# Patient Record
Sex: Female | Born: 1940 | Race: White | Hispanic: No | Marital: Married | Smoking: Never smoker
Health system: Southern US, Community
[De-identification: ages and names within clinical notes are randomized; demographics above are authoritative.]

## PROBLEM LIST (undated history)

## (undated) DIAGNOSIS — F419 Anxiety disorder, unspecified: Secondary | ICD-10-CM

## (undated) DIAGNOSIS — Z8669 Personal history of other diseases of the nervous system and sense organs: Secondary | ICD-10-CM

## (undated) DIAGNOSIS — I10 Essential (primary) hypertension: Secondary | ICD-10-CM

## (undated) DIAGNOSIS — H9193 Unspecified hearing loss, bilateral: Secondary | ICD-10-CM

## (undated) DIAGNOSIS — E785 Hyperlipidemia, unspecified: Secondary | ICD-10-CM

## (undated) HISTORY — DX: Essential (primary) hypertension: I10

## (undated) HISTORY — DX: Unspecified hearing loss, bilateral: H91.93

## (undated) HISTORY — PX: OTHER SURGICAL HISTORY: SHX169

## (undated) HISTORY — DX: Hyperlipidemia, unspecified: E78.5

## (undated) HISTORY — DX: Anxiety disorder, unspecified: F41.9

## (undated) HISTORY — PX: ABDOMINAL HYSTERECTOMY: SHX81

## (undated) HISTORY — DX: Personal history of other diseases of the nervous system and sense organs: Z86.69

---

## 1998-02-06 ENCOUNTER — Other Ambulatory Visit: Admission: RE | Admit: 1998-02-06 | Discharge: 1998-02-06 | Payer: Self-pay | Admitting: Obstetrics and Gynecology

## 1998-02-14 ENCOUNTER — Ambulatory Visit (HOSPITAL_COMMUNITY): Admission: RE | Admit: 1998-02-14 | Discharge: 1998-02-14 | Payer: Self-pay | Admitting: Obstetrics and Gynecology

## 1998-10-30 ENCOUNTER — Encounter: Payer: Self-pay | Admitting: Family Medicine

## 1998-10-30 ENCOUNTER — Ambulatory Visit (HOSPITAL_COMMUNITY): Admission: RE | Admit: 1998-10-30 | Discharge: 1998-10-30 | Payer: Self-pay | Admitting: Family Medicine

## 1998-11-14 ENCOUNTER — Inpatient Hospital Stay (HOSPITAL_COMMUNITY): Admission: AD | Admit: 1998-11-14 | Discharge: 1998-11-16 | Payer: Self-pay | Admitting: *Deleted

## 1998-11-14 ENCOUNTER — Emergency Department (HOSPITAL_COMMUNITY): Admission: EM | Admit: 1998-11-14 | Discharge: 1998-11-14 | Payer: Self-pay | Admitting: Emergency Medicine

## 1999-03-13 ENCOUNTER — Other Ambulatory Visit: Admission: RE | Admit: 1999-03-13 | Discharge: 1999-03-13 | Payer: Self-pay | Admitting: Family Medicine

## 2000-04-15 ENCOUNTER — Other Ambulatory Visit: Admission: RE | Admit: 2000-04-15 | Discharge: 2000-04-15 | Payer: Self-pay | Admitting: Family Medicine

## 2000-04-22 ENCOUNTER — Encounter: Payer: Self-pay | Admitting: Family Medicine

## 2000-04-22 ENCOUNTER — Ambulatory Visit (HOSPITAL_COMMUNITY): Admission: RE | Admit: 2000-04-22 | Discharge: 2000-04-22 | Payer: Self-pay | Admitting: Family Medicine

## 2001-07-01 ENCOUNTER — Ambulatory Visit (HOSPITAL_COMMUNITY): Admission: RE | Admit: 2001-07-01 | Discharge: 2001-07-01 | Payer: Self-pay | Admitting: Family Medicine

## 2001-07-01 ENCOUNTER — Encounter: Payer: Self-pay | Admitting: Family Medicine

## 2001-07-06 ENCOUNTER — Encounter: Payer: Self-pay | Admitting: Family Medicine

## 2001-07-06 ENCOUNTER — Encounter: Admission: RE | Admit: 2001-07-06 | Discharge: 2001-07-06 | Payer: Self-pay | Admitting: Family Medicine

## 2001-07-20 ENCOUNTER — Other Ambulatory Visit: Admission: RE | Admit: 2001-07-20 | Discharge: 2001-07-20 | Payer: Self-pay | Admitting: Family Medicine

## 2001-07-23 ENCOUNTER — Encounter: Admission: RE | Admit: 2001-07-23 | Discharge: 2001-07-23 | Payer: Self-pay | Admitting: Family Medicine

## 2001-07-23 ENCOUNTER — Encounter: Payer: Self-pay | Admitting: Family Medicine

## 2001-07-30 ENCOUNTER — Encounter: Admission: RE | Admit: 2001-07-30 | Discharge: 2001-07-30 | Payer: Self-pay | Admitting: Family Medicine

## 2001-07-30 ENCOUNTER — Encounter: Payer: Self-pay | Admitting: Family Medicine

## 2001-08-04 ENCOUNTER — Ambulatory Visit (HOSPITAL_COMMUNITY): Admission: RE | Admit: 2001-08-04 | Discharge: 2001-08-04 | Payer: Self-pay | Admitting: Internal Medicine

## 2001-08-04 ENCOUNTER — Encounter: Payer: Self-pay | Admitting: Obstetrics and Gynecology

## 2001-08-27 ENCOUNTER — Encounter: Payer: Self-pay | Admitting: Anesthesiology

## 2001-09-02 ENCOUNTER — Inpatient Hospital Stay (HOSPITAL_COMMUNITY): Admission: RE | Admit: 2001-09-02 | Discharge: 2001-09-04 | Payer: Self-pay | Admitting: Obstetrics and Gynecology

## 2001-09-02 ENCOUNTER — Encounter (INDEPENDENT_AMBULATORY_CARE_PROVIDER_SITE_OTHER): Payer: Self-pay | Admitting: *Deleted

## 2001-09-08 ENCOUNTER — Emergency Department (HOSPITAL_COMMUNITY): Admission: EM | Admit: 2001-09-08 | Discharge: 2001-09-08 | Payer: Self-pay | Admitting: Emergency Medicine

## 2001-09-08 ENCOUNTER — Encounter: Payer: Self-pay | Admitting: Emergency Medicine

## 2002-07-01 ENCOUNTER — Encounter: Payer: Self-pay | Admitting: Family Medicine

## 2002-07-01 ENCOUNTER — Ambulatory Visit (HOSPITAL_COMMUNITY): Admission: RE | Admit: 2002-07-01 | Discharge: 2002-07-01 | Payer: Self-pay | Admitting: Family Medicine

## 2002-07-09 ENCOUNTER — Encounter: Payer: Self-pay | Admitting: Family Medicine

## 2002-07-09 ENCOUNTER — Ambulatory Visit (HOSPITAL_COMMUNITY): Admission: RE | Admit: 2002-07-09 | Discharge: 2002-07-09 | Payer: Self-pay | Admitting: Family Medicine

## 2002-07-26 ENCOUNTER — Encounter: Payer: Self-pay | Admitting: Family Medicine

## 2002-07-26 ENCOUNTER — Encounter: Admission: RE | Admit: 2002-07-26 | Discharge: 2002-07-26 | Payer: Self-pay | Admitting: Family Medicine

## 2002-08-06 ENCOUNTER — Encounter: Admission: RE | Admit: 2002-08-06 | Discharge: 2002-08-06 | Payer: Self-pay | Admitting: Family Medicine

## 2002-08-06 ENCOUNTER — Encounter: Payer: Self-pay | Admitting: Family Medicine

## 2003-10-20 ENCOUNTER — Encounter: Admission: RE | Admit: 2003-10-20 | Discharge: 2003-10-20 | Payer: Self-pay | Admitting: Family Medicine

## 2004-09-19 ENCOUNTER — Encounter: Admission: RE | Admit: 2004-09-19 | Discharge: 2004-09-19 | Payer: Self-pay | Admitting: Family Medicine

## 2004-10-30 ENCOUNTER — Encounter: Admission: RE | Admit: 2004-10-30 | Discharge: 2004-10-30 | Payer: Self-pay | Admitting: Family Medicine

## 2005-12-12 ENCOUNTER — Encounter: Admission: RE | Admit: 2005-12-12 | Discharge: 2005-12-12 | Payer: Self-pay | Admitting: Family Medicine

## 2006-03-23 ENCOUNTER — Emergency Department (HOSPITAL_COMMUNITY): Admission: EM | Admit: 2006-03-23 | Discharge: 2006-03-23 | Payer: Self-pay | Admitting: Emergency Medicine

## 2006-12-18 ENCOUNTER — Encounter: Admission: RE | Admit: 2006-12-18 | Discharge: 2006-12-18 | Payer: Self-pay | Admitting: Family Medicine

## 2008-02-24 ENCOUNTER — Encounter: Admission: RE | Admit: 2008-02-24 | Discharge: 2008-02-24 | Payer: Self-pay | Admitting: Family Medicine

## 2008-07-14 ENCOUNTER — Encounter: Admission: RE | Admit: 2008-07-14 | Discharge: 2008-07-14 | Payer: Self-pay | Admitting: Family Medicine

## 2009-01-17 ENCOUNTER — Other Ambulatory Visit: Admission: RE | Admit: 2009-01-17 | Discharge: 2009-01-17 | Payer: Self-pay | Admitting: Internal Medicine

## 2009-01-17 ENCOUNTER — Ambulatory Visit: Payer: Self-pay | Admitting: Internal Medicine

## 2009-01-26 ENCOUNTER — Ambulatory Visit (HOSPITAL_COMMUNITY): Admission: RE | Admit: 2009-01-26 | Discharge: 2009-01-26 | Payer: Self-pay | Admitting: Internal Medicine

## 2009-01-26 ENCOUNTER — Encounter: Payer: Self-pay | Admitting: Internal Medicine

## 2009-01-26 ENCOUNTER — Ambulatory Visit: Payer: Self-pay | Admitting: Surgery

## 2009-02-09 ENCOUNTER — Ambulatory Visit: Payer: Self-pay | Admitting: Internal Medicine

## 2009-03-07 ENCOUNTER — Ambulatory Visit: Payer: Self-pay | Admitting: Vascular Surgery

## 2009-05-23 ENCOUNTER — Ambulatory Visit: Payer: Self-pay | Admitting: Internal Medicine

## 2009-07-13 ENCOUNTER — Ambulatory Visit: Payer: Self-pay | Admitting: Internal Medicine

## 2009-11-21 ENCOUNTER — Ambulatory Visit: Payer: Self-pay | Admitting: Internal Medicine

## 2010-05-22 ENCOUNTER — Ambulatory Visit: Payer: Self-pay | Admitting: Internal Medicine

## 2010-07-16 ENCOUNTER — Encounter: Admission: RE | Admit: 2010-07-16 | Discharge: 2010-07-16 | Payer: Self-pay | Admitting: Internal Medicine

## 2010-08-23 ENCOUNTER — Ambulatory Visit: Payer: Self-pay | Admitting: Internal Medicine

## 2010-10-28 ENCOUNTER — Encounter: Payer: Self-pay | Admitting: Gastroenterology

## 2010-12-11 ENCOUNTER — Other Ambulatory Visit: Payer: Self-pay | Admitting: Internal Medicine

## 2010-12-13 ENCOUNTER — Ambulatory Visit (INDEPENDENT_AMBULATORY_CARE_PROVIDER_SITE_OTHER): Payer: BC Managed Care – PPO | Admitting: Internal Medicine

## 2010-12-13 DIAGNOSIS — I10 Essential (primary) hypertension: Secondary | ICD-10-CM

## 2010-12-13 DIAGNOSIS — E039 Hypothyroidism, unspecified: Secondary | ICD-10-CM

## 2010-12-13 DIAGNOSIS — M1610 Unilateral primary osteoarthritis, unspecified hip: Secondary | ICD-10-CM

## 2010-12-13 DIAGNOSIS — M169 Osteoarthritis of hip, unspecified: Secondary | ICD-10-CM

## 2011-02-19 NOTE — Consult Note (Signed)
NEW PATIENT CONSULTATION   [name], [name] A  DOB:  1941/03/03                                       03/07/2009  ZOXWR#:[id number]   HISTORY OF PRESENT ILLNESS:  [name] is a 70 year old healthy female  found to have carotid bruit by Dr. Lenord Fellers and a carotid duplex exam  performed in April 2010 at [facility] revealed moderate  bilateral carotid occlusive disease.  She was referred for evaluation.  She denies history hemispheric or non-hemispheric TIAs, amaurosis fugax,  diplopia, blurred vision, syncope or previous stroke.   PAST MEDICAL HISTORY:  1. Hypertension.  2. Hyperlipidemia.  3. Hypothyroidism.  4. History of osteopenia.  5. Bilateral hearing loss.  6. Negative for coronary artery disease, diabetes, stroke or COPD.   PAST SURGICAL HISTORY:  Hysterectomy.   FAMILY HISTORY:  Positive for coronary artery disease in her father.  He  had coronary bypass grafting. Diabetes in the grandmother.  Negative for  stroke.   SOCIAL HISTORY:  She is married, has three children and is retired.  She  does not use tobacco or alcohol.   REVIEW OF SYSTEMS:  Essentially unremarkable with no weight gain,  anorexia, chest pain, dyspnea on exertion, PND, orthopnea, hemoptysis,  wheezing.  Does have arthritis and some degree of depression and  nervousness.   ALLERGIES:  None known.   MEDICATIONS:  Please see health history exam.   PHYSICAL EXAMINATION:  VITAL SIGNS:  Blood pressure 140/80, heart rate  70, respirations 14.  GENERAL:  This is a healthy-appearing female in no apparent stress alert  and oriented x3.  NECK:  Supple. 3+ carotid pulses palpable.  There is a soft bruit on the  left.  NEUROLOGIC:  Normal.  No palpable adenopathy in the neck.  CHEST:  Clear to auscultation.  CARDIOVASCULAR:  Regular rate and rhythm.  There is no murmurs.  ABDOMEN:  Soft, nontender with no masses.  EXTREMITIES:  She has 3+ femoral popliteal and posterior tibial  pulses  palpable bilaterally.   I reviewed the carotid duplex exam done at [facility] on January 26, 2009 and it appears that her internal carotid stenoses is in the 50-60%  range at most.  Since she is asymptomatic there is no indication for any  treatment or further evaluation at this time.  I will see her back in 1  year for follow-up carotid duplex exam in our office to discuss this  further with her unless she has any symptoms in the interim.   Quita Skye Hart [place], M.D.  Electronically Signed   JDL/MEDQ  D:  03/07/2009  T:  03/08/2009  Job:  [id number]   cc:   Luanna Cole. Lenord Fellers, M.D.

## 2011-02-22 NOTE — Discharge Summary (Signed)
[facility]  Patient:    [name], [name] Visit Number: [id number] MRN: [id number]          Service Type: SUR Location: 4W 0457 02 Attending Physician:  Malon Kindle Dictated by:   Malachi Pro. Ambrose Mantle, M.D. Admit Date:  09/02/2001 Disc. Date: 09/04/01   CC:         Mamie Nick, M.D.   Discharge Summary  HISTORY OF PRESENT ILLNESS: This is a 71 year old white female with a left adnexal abnormality on ultrasound and CT scan for diagnostic laparoscopy and possible TAH/BSO. The patient underwent laparoscopy with findings of dense adhesions of the sigmoid colon to the left tube and ovary that obliterated the left ovary. The patient had requested that if we did confirm pathology to go ahead and remove the uterus, tubes, and ovaries, so a laparoscopy was done, abdominal hysterectomy, bilateral salpingo-oophorectomy, and lysis of the dense adhesions of the sigmoid to the left tube and ovary. There were also peritoneal inclusions in the cul-de-sac. These were also removed.  Postoperatively, the patient did well. Her abdomen remained soft and nontender. She did not pass flatus and she tolerated liquids well. She voided well without difficulty and was ambulating well without assistance.  ADMISSION LABORATORY DATA: Showed a comprehensive metabolic profile to be normal except for a BUN of 27, a glucose of 130. This was taken 1 hour after eating a Pop Tart. White count was 68,000, hemoglobin 14, hematocrit 40.8, platelet count 311,000, normal differential. Urinalysis was negative. Postoperative hematocrit was 34.  ECG showed normal sinus rhythm, nonspecific T wave abnormality. 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 Pathology report is not available but other than the tremendous scar tissue along the sigmoid colon to the left tube and ovary, I do not think there was any significant  pathology.  OPERATION: Diagnostic open laparotomy with abdominal hysterectomy, bilateral salpingo-oophorectomy, and lysis of adhesions.  FINAL CONDITION: Improved.  INSTRUCTIONS: Include a clear liquid diet until passing flatus. Call with severe abdominal cramping, nausea or vomiting. Call with any fever above 100.4 degrees. Call with any heavy vaginal bleeding or any other problems. Mepergan Fortis 16 tablets 1 q.4-6h. as needed for pain.  The patient is advised to return in 3-4 days to have her staples removed.  FINAL DIAGNOSES: History of complex left ovarian cyst on ultrasound with persistent free fluid on ultrasound that suggested compartmentalization of the fluid with scarring. Dense pelvic adhesions involving the sigmoid colon, the left tube and ovary and cul-de-sac. Diverticulosis by computed tomographic scan without evidence of diverticulitis. Dictated by:   Malachi Pro. Ambrose Mantle, M.D.  Attending Physician:  Malon Kindle DD:  09/04/01 TD:  09/04/01 Job: [id number]

## 2011-02-22 NOTE — Op Note (Signed)
[facility]  Patient:    [name], [name] Visit Number: [id number] MRN: [id number]          Service Type: SUR Location: 4W 0457 02 Attending Physician:  Malon Kindle Dictated by:   Malachi Pro. Ambrose Mantle, M.D. Proc. Date: 09/02/01 Admit Date:  09/02/2001   CC:         Quita Skye. Artis Flock, M.D.   Operative Report  PREOPERATIVE DIAGNOSES:  Left adnexal abnormality on ultrasound with suggestion of peritoneal fluid that was loculated as well as a history of 4.8 cm complex cystic mass in the left ovary on ultrasound.  POSTOPERATIVE DIAGNOSES:  Left adnexal abnormality on ultrasound with suggestion of peritoneal fluid that was loculated as well as a history of 4.8 cm complex cystic mass in the left ovary on ultrasound. Dense adhesions of the sigmoid colon to the left tube and ovary and cul-de-sac, peritoneal inclusions in the cul-de-sac.  OPERATION PERFORMED:  1. Diagnostic open laparoscopy.  2. Laparotomy with abdominal hysterectomy and bilateral     salpingo-oophorectomy.  3. Lysis of adhesions.  SURGEON:  Malachi Pro. Ambrose Mantle, M.D.  ASSISTANT:  Alvino Chapel, M.D.  ANESTHESIA:  General.  DESCRIPTION OF PROCEDURE:  The patient was brought to the operating room and placed under satisfactory general anesthesia, placed in the [facility] stirrups. The abdomen, vulva, vagina and urethra were prepped with Betadine solution. A Foley catheter was inserted to straight drain. A Hulka cannula was placed into the uterus and attached to the anterior cervical lip. The abdomen was then draped as a sterile field. A semilunar subumbilical incision was made and carried in layers down to the fascia. The fascia was elevated and sized and the peritoneum was opened bluntly. The Hasson cannula was placed into the peritoneal cavity after a pursestring suture of #0 Vicryl was used to pull around the Hasson cannula in the fascia. The abdominal cavity was inflated.  I inserted another trocar under direct vision suprapubically, elevated the uterus into the operative field. The uterus was anterior normal size, the cul-de-sac was obliterated by adhesions from the sigmoid colon to the cul-de-sac and to the left adnexa. The anterior cul-de-sac was normal. The right tube and ovary were normal. I could not see the left tube and ovary. Because the patient had expressed a desire to go ahead and remove the uterus, tubes and ovaries if we did confirm pathology, I removed the cannula, I closed the subumbilical incision in the fascia by pulling tight on the fascial suture and then regloved and cut through the skin, subcutaneous tissue and fascia suprapubically. I separated the fascia from the rectus muscles superiorly and inferiorly and I opened the peritoneum vertically.  Exploration of the upper abdomen revealed there was one adhesions from the omentum to the right anterior abdominal wall. This was divided with the Bovie. The liver was smooth, the gallbladder was smooth and cystic without stones. Both kidneys felt normal. Exploration of the pelvis revealed the previously described findings seen at laparoscopy. Packs and retractors were used to prepare the operative field. The uterus was elevated with clamps across the upper pedicles. Both round ligaments were divided and a bladder flap was developed. With considerable dissection, the sigmoid colon was separated from the left adnexa sharply and bluntly. The patient had been known to have diverticulosis on CT scan. There was no evidence of diverticulitis on CT scan and I did not see any diverticulitis now. I could not be sure that there were diverticula, but I am sure  there are. At any rate, after the sigmoid colon was protected and divided away from the left adnexa, both infundibulopelvic ligaments were divided between clamps and then the uterine vessels were clamped, cut and suture ligated after they were  skeletonized. Double ligatures were placed across each infundibulopelvic ligament. The bladder was pushed well inferiorly, the parametrial and paracervical tissues were clamped, cut and suture ligated and the uterosacral ligaments bilaterally were divided and suture ligated and held. The right vaginal angle was entered and then the uterus, tubes and ovaries were removed by transecting the upper vagina. Vaginal angled sutures were placed and the central portion of the vagina was closed with interrupted figure-of-eight sutures of #0 Vicryl. A diligent search was made for any bleeders. I controlled all of them with suture and electrical current. The uterosacral ligaments were sutured together in the midline with two sutures of #0 Vicryl to give support to the vagina. I filled the bladder with methylene blue stained saline, there was no leaking, there was no evidence that the bladder was close to any sutures. Reperitonealization was done across the vaginal cuff. Again hemostasis appeared adequate. The bowel appeared relatively normal except for several of what I felt were diverticula. Packs and retractors were removed. The abdominal wall was closed with a running suture of #0 Vicryl on the peritoneum, interrupted #0 Vicryl on the rectus muscle, two running sutures of #0 Vicryl on the anterior rectus fascia, running 3-0 Vicryl on the subcu tissue and staples on the skin. A staple was also  used for the lower abdominal trocar incision as well as staples were used to close the umbilical skin incision. Blood loss for the procedure was thought to be about 250 cc. Pelvic washings were obtained as soon as we entered the abdominal cavity but they were discarded into the procedure because there was no evidence that there was any chance of ovarian cancer. Dictated by:   Malachi Pro. Ambrose Mantle, M.D. Attending Physician:  Malon Kindle DD:  09/02/01 TD:  09/02/01 Job: [id number]

## 2011-02-22 NOTE — H&P (Signed)
[facility]  Patient:    [name], [name] Visit Number: [id number] MRN: [id number]          Service Type: DSU Location: DAY Attending Physician:  Malon Kindle Dictated by:   Malachi Pro. Ambrose Mantle, M.D. Admit Date:  09/02/2001                           History and Physical  HISTORY OF PRESENT ILLNESS:  A 70 year old, white, married female, para 3-0-1-2, who is admitted to the hospital for diagnostic laparoscopy, possible laparotomy with abdominal hysterectomy and bilateral salpingo-oophorectomy.  This patient states that she has been in relatively good health, except for annual bout of lower abdominal pain with gas and belching that lasts about three days.  These symptoms occurred in September, but lasted longer than the usual three days.  She saw Quita Skye. Kindl, M.D., for routine exam and he felt a left adnexal fullness.  An ultrasound showed a 4.8 cm complex left ovarian cyst.  A CT scan was done that confirmed free fluid, but did not show the cyst.  The ultrasound report done on July 23, 2001, showed a 4.8 cm complex septated cyst measuring 3.3 cm x 3 cm x 4.8 cm. 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  Surrounding the left ovary was a moderate amount of free fluid.  No upper abdominal free fluid was seen.  The patient then had a CT scan of the abdomen and pelvis.  The abdominal parenchymal organs were normal in appearance.  There was no evidence of mass, adenopathy, inflammatory process, or abnormal fluid collections.  There was a subcentimeter inferior right renal cyst.  CT scan of the pelvis showed a moderate amount of pelvic free fluid. The ultrasound finding of an ovarian cystic focus is probably silhouetted by pelvic free fluid and is not identified as a discrete finding.  No adenopathy was seen.  No mesenteric or omental metastatic disease was seen.  There was moderate sigmoid  diverticulosis without diverticulitis.  The uterus, opacified intestine, and bladder were otherwise unremarkable.  When I examined the patient on July 31, 2001, I could not feel an adnexal mass, in fact her left adnexa felt perfectly normal.  Therefore, we repeated the ultrasound on August 04, 2001.  Again, there was a normal uterus and a moderate of fluid in the cul-de-sac.  Thin septations were noted in the fluid which were consistent with adhesions.  The previously noted complex cystic mass on the left was not seen.  It was possible that it was obscured by gas.  Another possibility would be that it represented a peritoneal inclusion cyst.  So at this point since we had an indeterminate finding and we did have a history of an enlarged left ovary, I recommended to the patient that she undergo laparoscopy before having more surgery.  She is unable to state whether she has dyspareunia because she has had very rare intercourse over the last three years, no more than three intercourse episodes in three years.  ALLERGIES:  No known allergies.  MEDICATIONS:  She takes Estrace, Provera, Lipitor, and Vioxx.  PAST SURGICAL HISTORY:  She did have periclitoral cyst removed.  She has had a tubal ligation.  PAST MEDICAL HISTORY:  She has had no significant nature adult illness, but she does take Lipitor for high cholesterol.  She has no known heart problems. She does not drink or smoke.  REVIEW  OF SYSTEMS:  Noncontributory.  FAMILY HISTORY:  Her mother died in her 104s of high blood pressure.  Her father died of an uncertain age of heart problems and lung cancer.  A sister and brother are both okay, but the sister does have high blood pressure.  The patient had a son who was a drug addict and he was killed in a robbery.  PHYSICAL EXAMINATION:  A well-developed, well-nourished, white female.  VITAL SIGNS:  Blood pressure 170/90, pulse 80.  WEIGHT:  152-1/2 pounds.  HEENT:  No cranial  abnormalities.  Extraocular movements are intact.  The nose and pharynx are clear.  There is an upper dental plate.  NECK:  Supple without thyromegaly.  HEART:  Normal size and sounds.  No murmurs.  LUNGS:  Clear to P&A.  BREASTS:  Soft without masses.  ABDOMEN:  Soft.  There are no masses palpable.  There is lower abdominal direct tenderness without rebound.  The liver, spleen, and kidneys are not felt.  PELVIC:  The vulva and vagina are clean.  The cervix is clean.  The uterus is anterior and normal size.  I do not feel an adnexal mass.  In fact, there is no suggestion of a mass on physical exam.  ADMITTING IMPRESSION:  History of complex cyst left adnexal mass measuring up to 4.8 cm in diameter with free pelvic fluid.  The free fluid persisted on two additional studies with compartmentalization on a follow-up ultrasound.  A Pap smear on July 20, 2001, was negative.  PLAN:  The patient is to undergo diagnostic laparoscopy and if the pelvis is completely without pathology, then no other surgery will be done.  If significant gynecologic pathology is present, the patient will undergo hysterectomy and removal of tubes and ovaries. Dictated by:   Malachi Pro. Ambrose Mantle, M.D. Attending Physician:  Malon Kindle DD:  09/02/01 TD:  09/02/01 Job: [id number]

## 2011-04-01 ENCOUNTER — Other Ambulatory Visit: Payer: Self-pay | Admitting: Internal Medicine

## 2011-06-14 ENCOUNTER — Other Ambulatory Visit: Payer: BC Managed Care – PPO | Admitting: Internal Medicine

## 2011-06-14 DIAGNOSIS — E785 Hyperlipidemia, unspecified: Secondary | ICD-10-CM

## 2011-06-14 DIAGNOSIS — Z79899 Other long term (current) drug therapy: Secondary | ICD-10-CM

## 2011-06-14 DIAGNOSIS — E039 Hypothyroidism, unspecified: Secondary | ICD-10-CM

## 2011-06-14 LAB — LIPID PANEL
Cholesterol: 227 mg/dL — ABNORMAL HIGH (ref 0–200)
HDL: 60 mg/dL (ref >39)
LDL Cholesterol: 142 mg/dL — ABNORMAL HIGH (ref 0–99)
Triglycerides: 125 mg/dL (ref <150)

## 2011-06-14 LAB — HEPATIC FUNCTION PANEL
ALT: 17 U/L (ref 0–35)
AST: 19 U/L (ref 0–37)
Albumin: 4.3 g/dL (ref 3.5–5.2)
Alkaline Phosphatase: 67 U/L (ref 39–117)
Bilirubin, Direct: 0.1 mg/dL (ref 0.0–0.3)
Indirect Bilirubin: 0.5 mg/dL (ref 0.0–0.9)
Total Protein: 7.2 g/dL (ref 6.0–8.3)

## 2011-06-14 LAB — TSH: TSH: 1.178 u[IU]/mL (ref 0.350–4.500)

## 2011-06-17 ENCOUNTER — Ambulatory Visit (INDEPENDENT_AMBULATORY_CARE_PROVIDER_SITE_OTHER): Payer: BC Managed Care – PPO | Admitting: Internal Medicine

## 2011-06-17 ENCOUNTER — Encounter: Payer: Self-pay | Admitting: Internal Medicine

## 2011-06-17 DIAGNOSIS — Z23 Encounter for immunization: Secondary | ICD-10-CM

## 2011-06-17 DIAGNOSIS — E785 Hyperlipidemia, unspecified: Secondary | ICD-10-CM | POA: Insufficient documentation

## 2011-06-17 DIAGNOSIS — M79609 Pain in unspecified limb: Secondary | ICD-10-CM

## 2011-06-17 DIAGNOSIS — M79606 Pain in leg, unspecified: Secondary | ICD-10-CM

## 2011-06-17 DIAGNOSIS — E039 Hypothyroidism, unspecified: Secondary | ICD-10-CM

## 2011-06-17 NOTE — Patient Instructions (Signed)
Please take Zocor daily as directed at supper. Continue with signed dose of Synthroid and signed dose of Zocor. An influenza immunization is been given today. Return in 6 months for physical exam.

## 2011-06-17 NOTE — Progress Notes (Signed)
  Subjective:    Patient ID: [name], female    DOB: 11/27/1940, 70 y.o.   MRN: [id number]  HPI  70 year old white female retired with history of hypothyroidism and hyperlipidemia. Admits that she's not been taking her Zocor on a regular basis and her lipid panel certainly reflects that. 6 months ago total cholesterol was 198 and it is now 227. LDL cholesterol 6 months ago was 116 and is now 142. Says she forgets at suppertime to take her medication but that is her bigger meal of the day. TSH is normal at 1.178 on thyroid replacement therapy. Also, sometimes her left lateral leg hurts around her knee area but she sits in a recliner and uses her legs to return from a reclining to an upright position. Suspect this is musculoskeletal pain and can be treated symptomatically.    Review of Systems     Objective:   Physical Exam chest is clear to auscultation; cardiac exam regular rate and rhythm normal S1 and S2; extremities without edema. Left lateral leg-mild tenderness.        Assessment & Plan:  Hypothyroidism  Hyperlipidemia  Noncompliance with medical regimen  Musculoskeletal pain left lateral leg  Encouraged patient to be compliant with Zocor on a daily basis. Return in 6 months for physical examination. Continue with same dose of Synthroid and same dose of Zocor for now. Influenza immunization given. Recommended Tylenol by mouth as needed for leg pain and may apply ice to left lateral leg 20 minutes daily for relief of pain.

## 2011-07-01 ENCOUNTER — Other Ambulatory Visit: Payer: Self-pay | Admitting: Internal Medicine

## 2011-09-01 ENCOUNTER — Other Ambulatory Visit: Payer: Self-pay | Admitting: Internal Medicine

## 2011-12-02 ENCOUNTER — Other Ambulatory Visit: Payer: Self-pay | Admitting: Internal Medicine

## 2011-12-23 ENCOUNTER — Other Ambulatory Visit: Payer: BC Managed Care – PPO | Admitting: Internal Medicine

## 2011-12-23 DIAGNOSIS — E785 Hyperlipidemia, unspecified: Secondary | ICD-10-CM

## 2011-12-23 DIAGNOSIS — Z0000 Encounter for general adult medical examination without abnormal findings: Secondary | ICD-10-CM

## 2011-12-23 DIAGNOSIS — E039 Hypothyroidism, unspecified: Secondary | ICD-10-CM

## 2011-12-23 LAB — COMPREHENSIVE METABOLIC PANEL
ALT: 15 U/L (ref 0–35)
AST: 23 U/L (ref 0–37)
Albumin: 4.6 g/dL (ref 3.5–5.2)
Alkaline Phosphatase: 67 U/L (ref 39–117)
BUN: 19 mg/dL (ref 6–23)
CO2: 27 mEq/L (ref 19–32)
Calcium: 9.7 mg/dL (ref 8.4–10.5)
Chloride: 102 mEq/L (ref 96–112)
Creat: 0.8 mg/dL (ref 0.50–1.10)
Glucose, Bld: 100 mg/dL — ABNORMAL HIGH (ref 70–99)
Potassium: 3.9 mEq/L (ref 3.5–5.3)
Sodium: 142 mEq/L (ref 135–145)
Total Bilirubin: 0.5 mg/dL (ref 0.3–1.2)

## 2011-12-23 LAB — LIPID PANEL
HDL: 63 mg/dL (ref >39)
LDL Cholesterol: 120 mg/dL — ABNORMAL HIGH (ref 0–99)
Total CHOL/HDL Ratio: 3.2 Ratio
Triglycerides: 106 mg/dL (ref <150)
VLDL: 21 mg/dL (ref 0–40)

## 2011-12-23 LAB — CBC WITH DIFFERENTIAL/PLATELET
Basophils Absolute: 0 10*3/uL (ref 0.0–0.1)
Basophils Relative: 0 % (ref 0–1)
Eosinophils Absolute: 0 10*3/uL (ref 0.0–0.7)
Eosinophils Relative: 1 % (ref 0–5)
HCT: 46.3 % — ABNORMAL HIGH (ref 36.0–46.0)
Hemoglobin: 15 g/dL (ref 12.0–15.0)
Lymphocytes Relative: 36 % (ref 12–46)
Lymphs Abs: 2 10*3/uL (ref 0.7–4.0)
MCH: 28.2 pg (ref 26.0–34.0)
MCHC: 32.4 g/dL (ref 30.0–36.0)
MCV: 87 fL (ref 78.0–100.0)
Monocytes Absolute: 0.6 10*3/uL (ref 0.1–1.0)
Monocytes Relative: 11 % (ref 3–12)
Neutro Abs: 3 10*3/uL (ref 1.7–7.7)
Neutrophils Relative %: 52 % (ref 43–77)
Platelets: 259 10*3/uL (ref 150–400)
RDW: 15.9 % — ABNORMAL HIGH (ref 11.5–15.5)
WBC: 5.7 10*3/uL (ref 4.0–10.5)

## 2011-12-24 ENCOUNTER — Ambulatory Visit (INDEPENDENT_AMBULATORY_CARE_PROVIDER_SITE_OTHER): Payer: BC Managed Care – PPO | Admitting: Internal Medicine

## 2011-12-24 ENCOUNTER — Encounter: Payer: Self-pay | Admitting: Internal Medicine

## 2011-12-24 VITALS — BP 144/80 | HR 72 | Temp 97.9°F | Ht 63.0 in | Wt 159.0 lb

## 2011-12-24 DIAGNOSIS — R0989 Other specified symptoms and signs involving the circulatory and respiratory systems: Secondary | ICD-10-CM

## 2011-12-24 DIAGNOSIS — F419 Anxiety disorder, unspecified: Secondary | ICD-10-CM | POA: Insufficient documentation

## 2011-12-24 DIAGNOSIS — F411 Generalized anxiety disorder: Secondary | ICD-10-CM

## 2011-12-24 DIAGNOSIS — Z0000 Encounter for general adult medical examination without abnormal findings: Secondary | ICD-10-CM

## 2011-12-24 DIAGNOSIS — I10 Essential (primary) hypertension: Secondary | ICD-10-CM

## 2011-12-24 LAB — POCT URINALYSIS DIPSTICK
Blood, UA: NEGATIVE
Glucose, UA: NEGATIVE
Ketones, UA: NEGATIVE
Leukocytes, UA: NEGATIVE
Nitrite, UA: NEGATIVE
Protein, UA: NEGATIVE
Spec Grav, UA: 1.01
pH, UA: 6

## 2011-12-24 LAB — VITAMIN D 25 HYDROXY (VIT D DEFICIENCY, FRACTURES): Vit D, 25-Hydroxy: 40 ng/mL (ref 30–89)

## 2011-12-24 NOTE — Progress Notes (Signed)
Subjective:    Patient ID: [name], female    DOB: [birth year], 71 y.o.   MRN: [id number]  HPI   71 year old white female in today for health maintenance and evaluation of medical problems. History of hysterectomy 2002 bilateral oophorectomy. History of hypertension treated with metoprolol and HCTZ. History of hypothyroidism treated with Synthroid. Bilateral hearing loss wearing bilateral hearing aids. Remote history of migraine headache. History of left carotid bruit. History of vitamin D deficiency. History of anxiety.  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 Pneumonia 2003. She did not get mammogram and 2012. Reminded her to get one this year. Bone density study 2002. Zostavax vaccine August 2011. Pneumovax 02-06-09.  Family history father died at age 47 of cancer. He had a history of MI. Mother with history of hypertension. Sister with history of hypertension. 2 adult sons.  Patient does not smoke or consume alcohol. She is married and completed high school and is retired.  Old records indicate she had carotid duplex scan in 1999 at [facility] long showing a 25-30% stenosis of the right internal carotid artery and a 30-35 stenosis in the left internal carotid artery . Has never had a colonoscopy and this is been discussed with her previously and she has to climb. Bone density study in 2002 showed a T score of -1.7 in the LS spine and femoral neck of -2.3.  Patient admits to not exercising much this winter. Likes to get out with her dog and walk in spring and summer.    Review of Systems  Constitutional: Negative.   HENT: Negative.   Eyes: Negative.   Respiratory: Negative.   Cardiovascular: Negative.   Gastrointestinal: Negative.   Genitourinary: Negative.   Musculoskeletal: Negative.   Neurological: Negative.   Hematological: Negative.   Psychiatric/Behavioral: Negative.        Objective:   Physical Exam  Vitals reviewed. Constitutional: She is  oriented to person, place, and time. She appears well-developed and well-nourished.  HENT:  Head: Normocephalic and atraumatic.  Right Ear: External ear normal.  Left Ear: External ear normal.  Mouth/Throat: Oropharynx is clear and moist.  Eyes: Conjunctivae are normal. Pupils are equal, round, and reactive to light. No scleral icterus.  Neck: Neck supple. No JVD present. No tracheal deviation present. No thyromegaly present.       Left carotid bruit  Cardiovascular: Normal rate, regular rhythm, normal heart sounds and intact distal pulses.   No murmur heard. Pulmonary/Chest: Effort normal and breath sounds normal. She has no wheezes. She has no rales. She exhibits no tenderness.       Breasts normal female  Abdominal: Soft. Bowel sounds are normal. She exhibits no distension and no mass. There is no tenderness. There is no guarding.  Genitourinary:       Deferred status post hysterectomy. Last Pap of vaginal cuff 2010  Musculoskeletal: Normal range of motion. She exhibits no edema.  Lymphadenopathy:    She has no cervical adenopathy.  Neurological: She is alert and oriented to person, place, and time. She has normal reflexes. She displays normal reflexes. No cranial nerve deficit. Coordination normal.  Skin: Skin is warm and dry. No rash noted. No erythema. No pallor.  Psychiatric: She has a normal mood and affect. Her behavior is normal. Judgment and thought content normal.          Assessment & Plan:  Hypertension  Hyperlipidemia  Hypothyroidism  Bilateral hearing loss  Left carotid bruit  History of vitamin  D deficiency  Anxiety  Plan: Patient encouraged diet exercise and lose some weight. Return in 6 months for TSH, fasting lipid panel, liver function, blood pressure check and office visit .

## 2011-12-24 NOTE — Patient Instructions (Signed)
Continue same medications. Try the diet exercise and lose weight. Return in 6 months.

## 2012-01-01 ENCOUNTER — Other Ambulatory Visit: Payer: Self-pay | Admitting: Internal Medicine

## 2012-04-03 ENCOUNTER — Other Ambulatory Visit: Payer: Self-pay | Admitting: Internal Medicine

## 2012-06-26 ENCOUNTER — Other Ambulatory Visit: Payer: Self-pay | Admitting: Internal Medicine

## 2012-06-26 DIAGNOSIS — Z1231 Encounter for screening mammogram for malignant neoplasm of breast: Secondary | ICD-10-CM

## 2012-06-29 ENCOUNTER — Other Ambulatory Visit: Payer: BC Managed Care – PPO | Admitting: Internal Medicine

## 2012-06-29 DIAGNOSIS — E039 Hypothyroidism, unspecified: Secondary | ICD-10-CM

## 2012-06-29 DIAGNOSIS — E785 Hyperlipidemia, unspecified: Secondary | ICD-10-CM

## 2012-06-29 DIAGNOSIS — Z79899 Other long term (current) drug therapy: Secondary | ICD-10-CM

## 2012-06-29 LAB — HEPATIC FUNCTION PANEL
ALT: 14 U/L (ref 0–35)
AST: 17 U/L (ref 0–37)
Albumin: 4.3 g/dL (ref 3.5–5.2)
Bilirubin, Direct: 0.1 mg/dL (ref 0.0–0.3)
Total Bilirubin: 0.5 mg/dL (ref 0.3–1.2)
Total Protein: 6.8 g/dL (ref 6.0–8.3)

## 2012-06-29 LAB — LIPID PANEL
Cholesterol: 189 mg/dL (ref 0–200)
HDL: 57 mg/dL (ref >39)
Total CHOL/HDL Ratio: 3.3 Ratio
Triglycerides: 142 mg/dL (ref <150)
VLDL: 28 mg/dL (ref 0–40)

## 2012-06-29 LAB — TSH: TSH: 1.414 u[IU]/mL (ref 0.350–4.500)

## 2012-06-30 ENCOUNTER — Ambulatory Visit (INDEPENDENT_AMBULATORY_CARE_PROVIDER_SITE_OTHER): Payer: BC Managed Care – PPO | Admitting: Internal Medicine

## 2012-06-30 ENCOUNTER — Encounter: Payer: Self-pay | Admitting: Internal Medicine

## 2012-06-30 VITALS — BP 140/80 | HR 64 | Temp 97.5°F | Ht 63.0 in | Wt 159.5 lb

## 2012-06-30 DIAGNOSIS — Z23 Encounter for immunization: Secondary | ICD-10-CM

## 2012-06-30 DIAGNOSIS — E039 Hypothyroidism, unspecified: Secondary | ICD-10-CM

## 2012-06-30 DIAGNOSIS — I10 Essential (primary) hypertension: Secondary | ICD-10-CM

## 2012-06-30 DIAGNOSIS — E785 Hyperlipidemia, unspecified: Secondary | ICD-10-CM

## 2012-06-30 DIAGNOSIS — N951 Menopausal and female climacteric states: Secondary | ICD-10-CM | POA: Insufficient documentation

## 2012-06-30 NOTE — Progress Notes (Signed)
  Subjective:    Patient ID: [name], female    DOB: [birth year], 71 y.o.   MRN: [id number]  HPI Moved back to old house recently after renovations. Busy moving. BP elevated today. Thinks blood pressure is elevated due to stress. History of hypertension hyperlipidemia hypothyroidism for six-month recheck. Complaining bitterly of hot flashes. Used to be on estrogen replacement status post hysterectomy but I had her stop that a number of months ago. At this point she really would like to restart Premarin orally status post hysterectomy BSO because she is having severe hot flashes at night.    Review of Systems     Objective:   Physical Exam neck is supple without thyromegaly JVD or carotid bruits; chest clear to auscultation. Cardiac exam regular rate and rhythm normal S1 and S2. Extremities without edema        Assessment & Plan:   Hot flashes- start Premarin 0.3 mg daily. Has had Hysterectomy BSO  HTN- pt to watch BP at home and call back if not well controlled  Hyperlipidemia-stable  Hypothyroidism stable on current dose of Synthroid. See TSH  Plan: Return in 6 months for physical examination. Patient is to watch her blood pressure at home and call back if it's not consistently lower than it is today. Addendum, TSH, liver functions are normal. Liver panel is normal with the exception of an LDL cholesterol of 104.

## 2012-07-08 ENCOUNTER — Other Ambulatory Visit: Payer: Self-pay | Admitting: Internal Medicine

## 2012-07-15 ENCOUNTER — Ambulatory Visit
Admission: RE | Admit: 2012-07-15 | Discharge: 2012-07-15 | Disposition: A | Discharge status: Home / Self Care | Payer: BC Managed Care – PPO | Source: Ambulatory Visit | Attending: Internal Medicine | Admitting: Internal Medicine

## 2012-07-15 DIAGNOSIS — Z1231 Encounter for screening mammogram for malignant neoplasm of breast: Secondary | ICD-10-CM

## 2012-08-03 ENCOUNTER — Telehealth: Payer: Self-pay | Admitting: Internal Medicine

## 2012-08-03 NOTE — Telephone Encounter (Signed)
Pt made aware that there are no generic meds available for Premarin (or one that Dr. Lenord Fellers would trust).  Pt verbalizes understanding and will remain with the Premarin with the refills that she has on it.

## 2012-08-03 NOTE — Telephone Encounter (Signed)
No generic available that I am aware of or would trust.

## 2012-12-02 ENCOUNTER — Other Ambulatory Visit: Payer: Self-pay | Admitting: Internal Medicine

## 2012-12-28 ENCOUNTER — Other Ambulatory Visit: Payer: BC Managed Care – PPO | Admitting: Internal Medicine

## 2012-12-28 ENCOUNTER — Other Ambulatory Visit: Payer: Self-pay | Admitting: Internal Medicine

## 2012-12-28 DIAGNOSIS — E039 Hypothyroidism, unspecified: Secondary | ICD-10-CM

## 2012-12-28 DIAGNOSIS — E785 Hyperlipidemia, unspecified: Secondary | ICD-10-CM

## 2012-12-28 DIAGNOSIS — Z0000 Encounter for general adult medical examination without abnormal findings: Secondary | ICD-10-CM

## 2012-12-28 LAB — CBC WITH DIFFERENTIAL/PLATELET
Eosinophils Absolute: 0.1 10*3/uL (ref 0.0–0.7)
Hemoglobin: 14.8 g/dL (ref 12.0–15.0)
Lymphocytes Relative: 41 % (ref 12–46)
Lymphs Abs: 2.2 10*3/uL (ref 0.7–4.0)
MCH: 28.5 pg (ref 26.0–34.0)
Monocytes Relative: 12 % (ref 3–12)
Neutro Abs: 2.5 10*3/uL (ref 1.7–7.7)
Neutrophils Relative %: 46 % (ref 43–77)
Platelets: 293 10*3/uL (ref 150–400)
RBC: 5.2 MIL/uL — ABNORMAL HIGH (ref 3.87–5.11)
WBC: 5.4 10*3/uL (ref 4.0–10.5)

## 2012-12-28 LAB — COMPREHENSIVE METABOLIC PANEL
ALT: 16 U/L (ref 0–35)
Albumin: 4.4 g/dL (ref 3.5–5.2)
CO2: 24 mEq/L (ref 19–32)
Potassium: 3.8 mEq/L (ref 3.5–5.3)
Sodium: 143 mEq/L (ref 135–145)
Total Bilirubin: 0.5 mg/dL (ref 0.3–1.2)
Total Protein: 6.8 g/dL (ref 6.0–8.3)

## 2012-12-28 LAB — LIPID PANEL
Cholesterol: 175 mg/dL (ref 0–200)
LDL Cholesterol: 93 mg/dL (ref 0–99)
VLDL: 21 mg/dL (ref 0–40)

## 2012-12-29 ENCOUNTER — Ambulatory Visit (INDEPENDENT_AMBULATORY_CARE_PROVIDER_SITE_OTHER): Payer: BC Managed Care – PPO | Admitting: Internal Medicine

## 2012-12-29 ENCOUNTER — Encounter: Payer: Self-pay | Admitting: Internal Medicine

## 2012-12-29 VITALS — BP 142/86 | HR 80 | Temp 98.2°F | Ht 62.75 in | Wt 161.0 lb

## 2012-12-29 DIAGNOSIS — E785 Hyperlipidemia, unspecified: Secondary | ICD-10-CM

## 2012-12-29 DIAGNOSIS — Z23 Encounter for immunization: Secondary | ICD-10-CM

## 2012-12-29 DIAGNOSIS — E039 Hypothyroidism, unspecified: Secondary | ICD-10-CM

## 2012-12-29 DIAGNOSIS — I10 Essential (primary) hypertension: Secondary | ICD-10-CM

## 2012-12-29 DIAGNOSIS — Z0000 Encounter for general adult medical examination without abnormal findings: Secondary | ICD-10-CM

## 2012-12-29 LAB — POCT URINALYSIS DIPSTICK
Blood, UA: NEGATIVE
Ketones, UA: NEGATIVE
Protein, UA: NEGATIVE
Spec Grav, UA: 1.015
pH, UA: 5.5

## 2012-12-29 LAB — VITAMIN D 25 HYDROXY (VIT D DEFICIENCY, FRACTURES): Vit D, 25-Hydroxy: 46 ng/mL (ref 30–89)

## 2012-12-29 MED ORDER — TETANUS-DIPHTH-ACELL PERTUSSIS 5-2.5-18.5 LF-MCG/0.5 IM SUSP: 0.5000 mL | Freq: Once | INTRAMUSCULAR | Status: DC

## 2012-12-29 MED ORDER — SIMVASTATIN 20 MG PO TABS: ORAL_TABLET | ORAL | Status: DC

## 2012-12-29 MED ORDER — SYNTHROID 88 MCG PO TABS: ORAL_TABLET | ORAL | Status: DC

## 2012-12-29 MED ORDER — HYDROCHLOROTHIAZIDE 25 MG PO TABS: ORAL_TABLET | ORAL | Status: DC

## 2012-12-29 MED ORDER — METOPROLOL TARTRATE 50 MG PO TABS: ORAL_TABLET | ORAL | Status: DC

## 2012-12-29 NOTE — Patient Instructions (Addendum)
Continue same meds and see in 6 months

## 2012-12-29 NOTE — Progress Notes (Signed)
Subjective:    Patient ID: [name], female    DOB: 11/15/1940, 72 y.o.   MRN: [id number]  HPI 72 year old white female for health maintenance of ventilation of medical problems. Past medical history includes hysterectomy in 2002 with bilateral oophorectomy. History of hypertension treated with metoprolol and HCTZ. History of hypothyroidism treated with Synthroid. Has history of bilateral hearing loss and wears bilateral hearing aids. Remote history of migraine headaches. History of left carotid bruit. History of vitamin D deficiency. History of anxiety. History of hyperlipidemia.  Patient had gout left wrist 2009. Had an ophthalmic migraine 1993. Had pneumonia 2003. Bone density study 2002. Zostavax vaccine August 2011. Pneumovax immunization April 2010.  Patient does not smoke or consume alcohol. She is married completed high school. She is retired.  Family history: Father died at age 59 of cancer. He had a history of MI. Mother with history of hypertension. Sister with history of hypertension. 2 adult sons.  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   Patient has never had colonoscopy.  Bone density study in 2002 showed a T score of -1.7 in the LS spine and femoral neck T score of -2.3.  Not exercising much this winter. Likes to get out and walk in the summer.  Stopped estrogen replacement therapy and now is having some hot flashes.    Review of Systems  Constitutional: Negative.   Eyes:       Wears glasses  Respiratory: Negative.   Gastrointestinal: Negative.   Endocrine: Negative.   Genitourinary: Negative.        Stopped hormones has a few hot flashes not severe  Neurological: Negative.        Hearing loss wears bilateral hearing aids  Psychiatric/Behavioral: Negative.        Objective:   Physical Exam  Constitutional: She is  oriented to person, place, and time. She appears well-developed and well-nourished. No distress.  HENT:  Head: Normocephalic and atraumatic.  Right Ear: External ear normal.  Left Ear: External ear normal.  Mouth/Throat: Oropharynx is clear and moist.  Eyes: Conjunctivae and EOM are normal. Pupils are equal, round, and reactive to light. Right eye exhibits no discharge. Left eye exhibits no discharge. No scleral icterus.  Neck: Neck supple. No JVD present. No thyromegaly present.  Left carotid bruit  Cardiovascular: Normal rate, regular rhythm, normal heart sounds and intact distal pulses.   No murmur heard. Pulmonary/Chest: No respiratory distress. She has no wheezes. She has no rales.  Breasts normal female  Abdominal: Soft. Bowel sounds are normal. She exhibits no distension and no mass. There is no tenderness. There is no rebound and no guarding.  Musculoskeletal: She exhibits no edema.  Neurological: She is alert and oriented to person, place, and time. She has normal reflexes. No cranial nerve deficit.  Skin: Rash noted. She is not diaphoretic.  Psychiatric: She has a normal mood and affect. Her behavior is normal. Judgment and thought content normal.          Assessment & Plan:  Hypothyroidism-stable  Hypertension  Hyperlipidemia  Left carotid bruit-asymptomatic  Bilateral hearing loss-wears bilateral hearing aids  History of vitamin D deficiency  Anxiety  Hot flashes-stay off forming medication  Plan: Patient is return in 6 months or as needed. No change in medication.         Subjective:  Patient presents for Medicare Annual/Subsequent preventive examination.   Review Past Medical/Family/Social: see EPIC   Risk Factors  Current exercise habits: sedentary walks in better weather Dietary issues discussed: low fat low carb advised  Cardiac risk factors: HTN  Depression Screen  (Note: if answer to either of the following is "Yes", a more complete  depression screening is indicated)   Over the past two weeks, have you felt down, depressed or hopeless? A couple of times a month feels a bit down for a short time Over the past two weeks, have you felt little interest or pleasure in doing things? No Have you lost interest or pleasure in daily life? No Do you often feel hopeless? No Do you cry easily over simple problems?  Yes is sensitive individual always has done this not new  Activities of Daily Living  In your present state of health, do you have any difficulty performing the following activities?:   Driving? No  Managing money? No  Feeding yourself? No  Getting from bed to chair? No  Climbing a flight of stairs? No  Preparing food and eating?: No  Bathing or showering? No  Getting dressed: No  Getting to the toilet? No  Using the toilet:No  Moving around from place to place: No  In the past year have you fallen or had a near fall?:No  Are you sexually active? No  Do you have more than one partner? No   Hearing Difficulties:  Yes- hearing aids both ears  Do you often ask people to speak up or repeat themselves? yes Do you experience ringing or noises in your ears? No  Do you have difficulty understanding soft or whispered voices? yes Do you feel that you have a problem with memory? No Do you often misplace items? occasionally   Home Safety:  Do you have a smoke alarm at your residence? Yes Do you have grab bars in the bathroom?no Do you have throw rugs in your house?yes   Cognitive Testing  Alert? Yes Normal Appearance?Yes  Oriented to person? Yes Place? Yes  Time? Yes  Recall of three objects? Yes  Can perform simple calculations? Yes  Displays appropriate judgment?Yes  Can read the correct time from a watch face?Yes   List the Names of Other Physician/Practitioners you currently use:  See referral list for the physicians patient is currently seeing. None chronically    Review of Systems:see  EPIC   Objective:     General appearance: Appears stated age and mildly obese  Head: Normocephalic, without obvious abnormality, atraumatic  Eyes: conj clear, EOMi PEERLA  Ears: normal TM's and external ear canals both ears  Nose: Nares normal. Septum midline. Mucosa normal. No drainage or sinus tenderness.  Throat: lips, mucosa, and tongue normal; teeth and gums normal  Neck: no adenopathy, no carotid bruit, no JVD, supple, symmetrical, trachea midline and thyroid not enlarged, symmetric, no tenderness/mass/nodules  No CVA tenderness.  Lungs: clear to auscultation bilaterally  Breasts: normal appearance, no masses or tenderness. Heart: regular rate and rhythm, S1, S2 normal, no murmur, click, rub or gallop  Abdomen: soft, non-tender; bowel sounds normal; no masses, no organomegaly  Musculoskeletal: ROM normal in all joints, no crepitus, no deformity, Normal muscle strengthen. Back  is symmetric, no curvature. Skin: Skin color, texture, turgor normal. No rashes or lesions  Lymph nodes: Cervical, supraclavicular, and axillary nodes normal.  Neurologic: CN 2 -12 Normal, Normal symmetric reflexes. Normal coordination and gait  Psych: Alert & Oriented x  3, Mood appear stable.    Assessment:    Annual wellness medicare exam   Plan:    During the course of the visit the patient was educated and counseled about appropriate screening and preventive services including:   See EPIC     Patient Instructions (the written plan) was given to the patient.  Medicare Attestation  I have personally reviewed:  The patient's medical and social history  Their use of alcohol, tobacco or illicit drugs  Their current medications and supplements  The patient's functional ability including ADLs,fall risks, home safety risks, cognitive, and hearing and visual impairment  Diet and physical activities  Evidence for depression or mood disorders  The patient's weight, height, BMI, and visual acuity  have been recorded in the chart. I have made referrals, counseling, and provided education to the patient based on review of the above and I have provided the patient with a written personalized care plan for preventive services.

## 2013-02-20 ENCOUNTER — Other Ambulatory Visit: Payer: Self-pay | Admitting: Internal Medicine

## 2013-05-19 ENCOUNTER — Other Ambulatory Visit: Payer: Self-pay

## 2013-05-19 MED ORDER — LEVOTHYROXINE SODIUM 88 MCG PO TABS: 88.0000 ug | ORAL_TABLET | Freq: Every day | ORAL | Status: DC

## 2013-07-05 ENCOUNTER — Other Ambulatory Visit: Payer: Self-pay | Admitting: Internal Medicine

## 2013-07-05 ENCOUNTER — Other Ambulatory Visit: Payer: BC Managed Care – PPO | Admitting: Internal Medicine

## 2013-07-05 DIAGNOSIS — E039 Hypothyroidism, unspecified: Secondary | ICD-10-CM

## 2013-07-05 DIAGNOSIS — E785 Hyperlipidemia, unspecified: Secondary | ICD-10-CM

## 2013-07-05 DIAGNOSIS — Z79899 Other long term (current) drug therapy: Secondary | ICD-10-CM

## 2013-07-05 LAB — HEPATIC FUNCTION PANEL
ALT: 13 U/L (ref 0–35)
Albumin: 4.5 g/dL (ref 3.5–5.2)
Alkaline Phosphatase: 62 U/L (ref 39–117)
Bilirubin, Direct: 0.1 mg/dL (ref 0.0–0.3)
Indirect Bilirubin: 0.5 mg/dL (ref 0.0–0.9)
Total Protein: 6.8 g/dL (ref 6.0–8.3)

## 2013-07-05 LAB — TSH: TSH: 0.692 u[IU]/mL (ref 0.350–4.500)

## 2013-07-05 LAB — LIPID PANEL
Cholesterol: 170 mg/dL (ref 0–200)
LDL Cholesterol: 87 mg/dL (ref 0–99)
Total CHOL/HDL Ratio: 2.9 Ratio
VLDL: 24 mg/dL (ref 0–40)

## 2013-07-06 ENCOUNTER — Ambulatory Visit (INDEPENDENT_AMBULATORY_CARE_PROVIDER_SITE_OTHER): Payer: BC Managed Care – PPO | Admitting: Internal Medicine

## 2013-07-06 ENCOUNTER — Encounter: Payer: Self-pay | Admitting: Internal Medicine

## 2013-07-06 VITALS — BP 140/88 | HR 64 | Temp 98.0°F | Wt 164.5 lb

## 2013-07-06 DIAGNOSIS — E039 Hypothyroidism, unspecified: Secondary | ICD-10-CM

## 2013-07-06 DIAGNOSIS — E785 Hyperlipidemia, unspecified: Secondary | ICD-10-CM

## 2013-07-06 DIAGNOSIS — I10 Essential (primary) hypertension: Secondary | ICD-10-CM

## 2013-07-06 DIAGNOSIS — Z23 Encounter for immunization: Secondary | ICD-10-CM

## 2013-07-06 DIAGNOSIS — E669 Obesity, unspecified: Secondary | ICD-10-CM

## 2013-07-06 LAB — HEMOGLOBIN A1C
Hgb A1c MFr Bld: 5.8 % — ABNORMAL HIGH (ref <5.7)
Mean Plasma Glucose: 120 mg/dL — ABNORMAL HIGH (ref <117)

## 2013-07-06 MED ORDER — LEVOTHYROXINE SODIUM 88 MCG PO TABS: 88.0000 ug | ORAL_TABLET | Freq: Every day | ORAL | Status: DC

## 2013-07-06 MED ORDER — HYDROCHLOROTHIAZIDE 25 MG PO TABS: ORAL_TABLET | ORAL | Status: DC

## 2013-07-06 MED ORDER — METOPROLOL TARTRATE 50 MG PO TABS: 50.0000 mg | ORAL_TABLET | ORAL | Status: DC

## 2013-07-06 MED ORDER — SIMVASTATIN 20 MG PO TABS: ORAL_TABLET | ORAL | Status: DC

## 2013-07-06 NOTE — Progress Notes (Signed)
  Subjective:    Patient ID: [name], female    DOB: [birth year], 72 y.o.   MRN: [id number]  HPI Patient here today for six-month recheck on hypertension, hypothyroidism and hyperlipidemia. She is now retired. His been sitting on couch and not exercising very much. Needs to at least walk daily. Has not been checking blood pressure at home. Was elevated in the office today. Patient needs to monitor her blood pressure several times weekly and advise me if it is remaining elevated. Has occasional musculoskeletal pains that come and go in arms and legs. She's wondering if it is related to statin medication and I do not think so and I might want to change statin medication or discontinue it. Her lipid panel is normal on statin medication. Liver functions are normal. She asked that her hemoglobin A1c be checked although her glucose at her last physical exam was 104. Liver functions are normal. TSH is normal on thyroid replacement therapy.    Review of Systems     Objective:   Physical Exam Skin warm and dry. Nodes none. Neck is supple without JVD thyromegaly. Chest: clear to auscultation. Cardiac exam: regular rate and rhythm normal S1 and S2. Extremities without edema.        Assessment & Plan:  Hypertension-patient to monitor blood pressure 3 times weekly and call if it remains elevated  Hyperlipidemia-lipid panel liver functions normal on statin medication  Hypothyroidism-TSH within normal limits on thyroid replacement therapy  Plan: Influenza vaccine given. Return in 6 months for physical exam. Continue same medications. Add hemoglobin A1c to recent lab work at her request to screen for diabetes.

## 2013-07-06 NOTE — Patient Instructions (Addendum)
Monitor your blood pressure 3 times weekly. Try to walk daily. Flu vaccine given today. Return in 6 months for physical exam. Call if blood pressure remains elevated.

## 2013-07-07 NOTE — Progress Notes (Signed)
Patient advised.

## 2013-07-12 ENCOUNTER — Other Ambulatory Visit: Payer: Self-pay | Admitting: Internal Medicine

## 2013-07-22 ENCOUNTER — Other Ambulatory Visit: Payer: Self-pay | Admitting: Internal Medicine

## 2013-07-26 ENCOUNTER — Other Ambulatory Visit: Payer: Self-pay | Admitting: Internal Medicine

## 2013-09-24 ENCOUNTER — Ambulatory Visit: Payer: BC Managed Care – PPO | Admitting: Internal Medicine

## 2013-12-30 ENCOUNTER — Other Ambulatory Visit: Payer: BC Managed Care – PPO | Admitting: Internal Medicine

## 2013-12-30 DIAGNOSIS — Z1329 Encounter for screening for other suspected endocrine disorder: Secondary | ICD-10-CM

## 2013-12-30 DIAGNOSIS — I10 Essential (primary) hypertension: Secondary | ICD-10-CM

## 2013-12-30 DIAGNOSIS — Z130 Encounter for screening for diseases of the blood and blood-forming organs and certain disorders involving the immune mechanism: Secondary | ICD-10-CM

## 2013-12-30 DIAGNOSIS — E039 Hypothyroidism, unspecified: Secondary | ICD-10-CM

## 2013-12-30 DIAGNOSIS — E785 Hyperlipidemia, unspecified: Secondary | ICD-10-CM

## 2013-12-30 DIAGNOSIS — Z13228 Encounter for screening for other metabolic disorders: Secondary | ICD-10-CM

## 2013-12-30 LAB — COMPREHENSIVE METABOLIC PANEL
ALBUMIN: 4.3 g/dL (ref 3.5–5.2)
ALT: 16 U/L (ref 0–35)
AST: 21 U/L (ref 0–37)
Alkaline Phosphatase: 55 U/L (ref 39–117)
BILIRUBIN TOTAL: 0.5 mg/dL (ref 0.2–1.2)
BUN: 16 mg/dL (ref 6–23)
CALCIUM: 9.6 mg/dL (ref 8.4–10.5)
CHLORIDE: 104 meq/L (ref 96–112)
CO2: 28 meq/L (ref 19–32)
Creat: 0.85 mg/dL (ref 0.50–1.10)
GLUCOSE: 91 mg/dL (ref 70–99)
Potassium: 3.9 mEq/L (ref 3.5–5.3)
SODIUM: 141 meq/L (ref 135–145)
TOTAL PROTEIN: 6.5 g/dL (ref 6.0–8.3)

## 2013-12-30 LAB — CBC WITH DIFFERENTIAL/PLATELET
Basophils Absolute: 0.1 10*3/uL (ref 0.0–0.1)
Basophils Relative: 1 % (ref 0–1)
Eosinophils Absolute: 0.1 10*3/uL (ref 0.0–0.7)
Eosinophils Relative: 1 % (ref 0–5)
HCT: 44.2 % (ref 36.0–46.0)
HEMOGLOBIN: 15.2 g/dL — AB (ref 12.0–15.0)
LYMPHS ABS: 2.3 10*3/uL (ref 0.7–4.0)
Lymphocytes Relative: 40 % (ref 12–46)
MCH: 28.8 pg (ref 26.0–34.0)
MCHC: 34.4 g/dL (ref 30.0–36.0)
MCV: 83.9 fL (ref 78.0–100.0)
MONOS PCT: 11 % (ref 3–12)
Monocytes Absolute: 0.6 10*3/uL (ref 0.1–1.0)
NEUTROS ABS: 2.7 10*3/uL (ref 1.7–7.7)
Neutrophils Relative %: 47 % (ref 43–77)
Platelets: 285 10*3/uL (ref 150–400)
RBC: 5.27 MIL/uL — AB (ref 3.87–5.11)
RDW: 15.8 % — ABNORMAL HIGH (ref 11.5–15.5)
WBC: 5.8 10*3/uL (ref 4.0–10.5)

## 2013-12-30 LAB — LIPID PANEL
CHOLESTEROL: 227 mg/dL — AB (ref 0–200)
HDL: 65 mg/dL (ref >39)
LDL Cholesterol: 139 mg/dL — ABNORMAL HIGH (ref 0–99)
Total CHOL/HDL Ratio: 3.5 Ratio
Triglycerides: 114 mg/dL (ref <150)
VLDL: 23 mg/dL (ref 0–40)

## 2013-12-31 ENCOUNTER — Encounter: Payer: Self-pay | Admitting: Internal Medicine

## 2013-12-31 ENCOUNTER — Ambulatory Visit (INDEPENDENT_AMBULATORY_CARE_PROVIDER_SITE_OTHER): Payer: BC Managed Care – PPO | Admitting: Internal Medicine

## 2013-12-31 VITALS — BP 148/84 | HR 68 | Temp 97.5°F | Ht 63.0 in | Wt 159.0 lb

## 2013-12-31 DIAGNOSIS — F411 Generalized anxiety disorder: Secondary | ICD-10-CM

## 2013-12-31 DIAGNOSIS — Z0000 Encounter for general adult medical examination without abnormal findings: Secondary | ICD-10-CM

## 2013-12-31 DIAGNOSIS — E785 Hyperlipidemia, unspecified: Secondary | ICD-10-CM

## 2013-12-31 DIAGNOSIS — I10 Essential (primary) hypertension: Secondary | ICD-10-CM

## 2013-12-31 DIAGNOSIS — G4700 Insomnia, unspecified: Secondary | ICD-10-CM

## 2013-12-31 DIAGNOSIS — E039 Hypothyroidism, unspecified: Secondary | ICD-10-CM

## 2013-12-31 LAB — POCT URINALYSIS DIPSTICK
BILIRUBIN UA: NEGATIVE
Blood, UA: NEGATIVE
Glucose, UA: NEGATIVE
Ketones, UA: NEGATIVE
Leukocytes, UA: NEGATIVE
NITRITE UA: NEGATIVE
PH UA: 5
Protein, UA: NEGATIVE
Spec Grav, UA: 1.02
Urobilinogen, UA: NEGATIVE

## 2013-12-31 LAB — VITAMIN D 25 HYDROXY (VIT D DEFICIENCY, FRACTURES): VIT D 25 HYDROXY: 47 ng/mL (ref 30–89)

## 2013-12-31 LAB — TSH: TSH: 6.748 u[IU]/mL — ABNORMAL HIGH (ref 0.350–4.500)

## 2013-12-31 MED ORDER — ALPRAZOLAM 0.5 MG PO TBDP: 0.5000 mg | ORAL_TABLET | Freq: Every evening | ORAL | Status: DC | PRN

## 2013-12-31 NOTE — Patient Instructions (Addendum)
Take BP readings at home x 2 weeks and call with results. Restart Zocor and make sure you take Thyroid medication each day. Return in 3 months and please have mammogram and bone density study as orders have been placed. 3 hemoccult cards given.

## 2013-12-31 NOTE — Progress Notes (Signed)
Subjective:    Patient ID: [name], female    DOB: April 20, 1941, 73 y.o.   MRN: [id number]  HPI 73 year old white female in today for health maintenance exam. May not have taken simvastatin on regular basis recently because she was taking some over-the-counter product for hyperlipidemia. This is reflected in her lab results. Also has not been taking levothyroxin on a regular basis and this also is reflected in her lab results. Blood pressure is elevated today at 148/84. Says she took her medication prior to coming to the office. She has a home blood pressure cuff but has not been using it. I have suggested she watch her blood pressure on a more regular basis and let us know the results of the next couple of weeks. She has a history of bilateral hearing loss and wears bilateral hearing aids. History of anxiety. Remote history of migraine headaches. History of vitamin D deficiency.  Past medical history: Hysterectomy in 2002 with bilateral oophorectomy. History of hypertension treated with metoprolol and HCTZ. History of hypothyroidism. History of hyperlipidemia. Patient had gout left wrist 2009. History of ophthalmic migraine 1993. Had pneumonia in 2003.  Had Zostavax vaccine August 2011. Pneumovax immunization April 2010.  Social history: She is married and completed high school. She is retired. Does not smoke or consume alcohol. 2 adult sons.  Family history: Father died at age 18 of cancer. He had history of MI. Mother with history of hypertension. Sister with history of hypertension.  Patient has never had colonoscopy.  Patient had bone density study in 2002 showing a T score of -1.7 and LS-spine and femoral neck T score of -2.3.  History of left carotid bruit. She had carotid duplex study in 1999 showing a 25-30% stenosis of the right internal carotid and a 30-35% stenosis in the left internal carotid artery.  Formerly took estrogen replacement therapy but has stopped that.  Admits  to not exercising very much recently.    Review of Systems  Constitutional: Negative.   HENT:       Bilateral hearing loss. Wears glasses.  Respiratory: Negative.   Cardiovascular:       History of left carotid bruit but is asymptomatic  Endocrine:       History of hypothyroidism but may have missed several doses of thyroid medication recently. History of hyperlipidemia and has stopped taking statin medication and started over-the-counter lipid-lowering medication.  Genitourinary: Negative.   Allergic/Immunologic: Negative.   Neurological: Negative.   Hematological: Negative.   Psychiatric/Behavioral:       History of anxiety and insomnia       Objective:   Physical Exam  Vitals reviewed. Constitutional: She is oriented to person, place, and time. She appears well-developed and well-nourished. No distress.  HENT:  Head: Normocephalic and atraumatic.  Right Ear: External ear normal.  Left Ear: External ear normal.  Mouth/Throat: Oropharynx is clear and moist. No oropharyngeal exudate.  Eyes: Conjunctivae and EOM are normal. Pupils are equal, round, and reactive to light. Right eye exhibits no discharge. Left eye exhibits no discharge. No scleral icterus.  Neck: Neck supple. No thyromegaly present.  Cardiovascular: Normal rate, regular rhythm, normal heart sounds and intact distal pulses.   No murmur heard. Pulmonary/Chest: Effort normal and breath sounds normal. No respiratory distress. She has no wheezes. She has no rales.  Breasts normal female  Abdominal: Soft. Bowel sounds are normal. She exhibits no distension and no mass. There is no tenderness. There is no rebound and no  guarding.  Genitourinary:  Deferred status post hysterectomy BSO  Musculoskeletal: Normal range of motion. She exhibits no edema.  Lymphadenopathy:    She has no cervical adenopathy.  Neurological: She is alert and oriented to person, place, and time. She has normal reflexes. Coordination normal.    Skin: Skin is warm and dry. No rash noted. She is not diaphoretic.  Psychiatric: She has a normal mood and affect. Her behavior is normal. Judgment and thought content normal.          Assessment & Plan:  Hypertension-blood pressure elevated in office today. She is keeping her blood pressure return in 3 months for reevaluation  Hyperlipidemia-she stopped taking statin medicine is taking some over-the-counter product. I will her back on statin medication and reevaluate in 3 months  Hypothyroidism-she has missed several doses of thyroid replacement and this is reflected in her lab results. On her to take her medication regularly and return in 3 months  Left carotid bruit-asymptomatic  Anxiety-takes Xanax at night for sleep  Health maintenance: Has never had colonoscopy. Given 3 Hemoccult cards, order mammogram and bone density study. Followup in 3 months.   Subjective:   Patient presents for Medicare Annual/Subsequent preventive examination.   Review Past Medical/Family/Social: see above   Risk Factors  Current exercise habits: sedentary Dietary issues discussed: low fat low carb  Cardiac risk factors: HTN. hyperlipidemia  Depression Screen  (Note: if answer to either of the following is "Yes", a more complete depression screening is indicated)  Over the past two weeks, have you felt down, depressed or hopeless? Yes  Over the past two weeks, have you felt little interest or pleasure in doing things? Yes  Have you lost interest or pleasure in daily life? Yes  Do you often feel hopeless? Yes  Do you cry easily over simple problems? No   Activities of Daily Living  In your present state of health, do you have any difficulty performing the following activities?:  Driving? No  Managing money? No  Feeding yourself? No  Getting from bed to chair? No  Climbing a flight of stairs? No  Preparing food and eating?: No  Bathing or showering? No  Getting dressed: No  Getting to  the toilet? No  Using the toilet:No  Moving around from place to place: No  In the past year have you fallen or had a near fall?:No  Are you sexually active? No  Do you have more than one partner? No   Hearing Difficulties: No  Do you often ask people to speak up or repeat themselves? No  Do you experience ringing or noises in your ears? Yes  Do you have difficulty understanding soft or whispered voices? Yes has bilateral hearing aids Do you feel that you have a problem with memory? Yes  Do you often misplace items? occasionally Do you feel safe at home? Yes   Cognitive Testing  Alert? Yes Normal Appearance?Yes  Oriented to person? Yes Place? Yes  Time? Yes  Recall of three objects? Yes  Can perform simple calculations? Yes  Displays appropriate judgment?Yes  Can read the correct time from a watch face?Yes   List the Names of Other Physician/Practitioners you currently use:  Eye physician  Indicate any recent Medical Services you may have received from other than [facility] providers in the past year (date may be approximate).   Screening Tests / Date Colonoscopy - never               Zostavax -  done Mammogram - forgot last year order placed Influenza Vaccine  To be done Fall 2015 Tetanus/tdap up to date   Objective:       General appearance: Appears stated age and mildly obese  Head: Normocephalic, without obvious abnormality, atraumatic  Eyes: conj clear, EOMi PEERLA  Ears: normal TM's and external ear canals both ears  Nose: Nares normal. Septum midline. Mucosa normal. No drainage or sinus tenderness.  Throat: lips, mucosa, and tongue normal; teeth and gums normal  Neck: no adenopathy,  no JVD, supple, symmetrical, trachea midline and thyroid not enlarged, symmetric, no tenderness/mass/nodules soft left carotid bruit No CVA tenderness.  Lungs: clear to auscultation bilaterally  Breasts: normal appearance, no masses or tendernes Heart: regular rate and rhythm, S1, S2  normal, no murmur, click, rub or gallop  Abdomen: soft, non-tender; bowel sounds normal; no masses, no organomegaly  Musculoskeletal: ROM normal in all joints, no crepitus, no deformity, Normal muscle strengthen. Back  is symmetric, no curvature. Skin: Skin color, texture, turgor normal. No rashes or lesions  Lymph nodes: Cervical, supraclavicular, and axillary nodes normal.  Neurologic: CN 2 -12 Normal, Normal symmetric reflexes. Normal coordination and gait  Psych: Alert & Oriented x 3, Mood appear stable.    Assessment:    Annual wellness medicare exam   Plan:    During the course of the visit the patient was educated and counseled about appropriate screening and preventive services including:  Screening mammography  Colorectal cancer screening - 3 hemoocult cards given Medicare Attestation  I have personally reviewed:  The patient's medical and social history  Their use of alcohol, tobacco or illicit drugs  Their current medications and supplements  The patient's functional ability including ADLs,fall risks, home safety risks, cognitive, and hearing and visual impairment  Diet and physical activities  Evidence for depression or mood disorders  The patient's weight, height, BMI, and visual acuity have been recorded in the chart. I have made referrals, counseling, and provided education to the patient based on review of the above and I have provided the patient with a written personalized care plan for preventive services.    !

## 2014-01-01 ENCOUNTER — Encounter: Payer: Self-pay | Admitting: Internal Medicine

## 2014-03-13 ENCOUNTER — Other Ambulatory Visit: Payer: Self-pay | Admitting: Internal Medicine

## 2014-04-05 ENCOUNTER — Other Ambulatory Visit: Payer: BC Managed Care – PPO | Admitting: Internal Medicine

## 2014-04-05 DIAGNOSIS — Z79899 Other long term (current) drug therapy: Secondary | ICD-10-CM

## 2014-04-05 DIAGNOSIS — E785 Hyperlipidemia, unspecified: Secondary | ICD-10-CM

## 2014-04-05 DIAGNOSIS — E039 Hypothyroidism, unspecified: Secondary | ICD-10-CM

## 2014-04-05 LAB — HEPATIC FUNCTION PANEL
ALK PHOS: 63 U/L (ref 39–117)
ALT: 15 U/L (ref 0–35)
AST: 17 U/L (ref 0–37)
Albumin: 4.3 g/dL (ref 3.5–5.2)
BILIRUBIN INDIRECT: 0.6 mg/dL (ref 0.2–1.2)
Bilirubin, Direct: 0.1 mg/dL (ref 0.0–0.3)
Total Bilirubin: 0.7 mg/dL (ref 0.2–1.2)
Total Protein: 6.6 g/dL (ref 6.0–8.3)

## 2014-04-05 LAB — LIPID PANEL
Cholesterol: 193 mg/dL (ref 0–200)
HDL: 63 mg/dL (ref >39)
LDL CALC: 108 mg/dL — AB (ref 0–99)
Total CHOL/HDL Ratio: 3.1 Ratio
Triglycerides: 110 mg/dL (ref <150)
VLDL: 22 mg/dL (ref 0–40)

## 2014-04-05 LAB — TSH: TSH: 0.212 u[IU]/mL — ABNORMAL LOW (ref 0.350–4.500)

## 2014-04-07 ENCOUNTER — Ambulatory Visit (INDEPENDENT_AMBULATORY_CARE_PROVIDER_SITE_OTHER): Payer: BC Managed Care – PPO | Admitting: Internal Medicine

## 2014-04-07 ENCOUNTER — Encounter: Payer: Self-pay | Admitting: Internal Medicine

## 2014-04-07 VITALS — BP 144/84 | HR 72 | Temp 97.9°F | Wt 163.0 lb

## 2014-04-07 DIAGNOSIS — E785 Hyperlipidemia, unspecified: Secondary | ICD-10-CM

## 2014-04-07 DIAGNOSIS — E039 Hypothyroidism, unspecified: Secondary | ICD-10-CM

## 2014-04-07 DIAGNOSIS — I10 Essential (primary) hypertension: Secondary | ICD-10-CM

## 2014-04-07 MED ORDER — LOSARTAN POTASSIUM 50 MG PO TABS: 50.0000 mg | ORAL_TABLET | Freq: Every day | ORAL | Status: DC

## 2014-04-07 NOTE — Progress Notes (Signed)
   Subjective:    Patient ID: [name], female    DOB: 02/08/1941, 73 y.o.   MRN: [id number]  HPI  In today to followup on hypothyroidism, hypertension, hyperlipidemia. Lipid panel and liver functions are within normal limits on statin medication. She's been taking thyroid replacement medicine consistently but TSH is a bit low. However sometimes she feels tired to were not changed dose at this point in time. Sometimes she doesn't sleep well at night. Last visit I gave her something for sleep and she should take that regularly. She's had sleep disturbance for years that she was a teenager. Her blood pressure continues to fluctuate. Sometimes is 120/80 and sometimes it's in the 140s. Seems to be about half the time it's either one or the other. She is on metoprolol 50 mg daily and a diuretic. I'm going to add losartan 50 mg daily for added blood pressure control.  She brought in 3 Hemoccult cards that she's not willing to have colonoscopy. Unfortunately during that time she had diarrhea and one of these cards is positive. I have given her another set to bring back in 4 weeks    Review of Systems     Objective:   Physical Exam   Spent 15 minutes speaking with her about these issues     Assessment & Plan:  Hypertension-add losartan 50 mg daily to HCTZ and metoprolol. Return in 4 weeks  Hypothyroidism-TSH is low but continue same dose of thyroid replacement therapy for nail  Hemoccult-positive card x1-likely of an error. Given another set to do on firm stool only  Hyperlipidemia-stable on statin therapy  Plan: Return in 4 weeks for blood pressure check and basic metabolic panel

## 2014-04-07 NOTE — Patient Instructions (Addendum)
Add losartan 50 mg daily to blood pressure regimen. Continue other medications as previously prescribed. Resubmit Hemoccult cards. Return in 4 weeks

## 2014-05-03 ENCOUNTER — Other Ambulatory Visit: Payer: BC Managed Care – PPO | Admitting: Internal Medicine

## 2014-05-03 DIAGNOSIS — Z79899 Other long term (current) drug therapy: Secondary | ICD-10-CM

## 2014-05-03 DIAGNOSIS — Z130 Encounter for screening for diseases of the blood and blood-forming organs and certain disorders involving the immune mechanism: Secondary | ICD-10-CM

## 2014-05-03 DIAGNOSIS — E039 Hypothyroidism, unspecified: Secondary | ICD-10-CM

## 2014-05-03 DIAGNOSIS — I10 Essential (primary) hypertension: Secondary | ICD-10-CM

## 2014-05-03 DIAGNOSIS — E785 Hyperlipidemia, unspecified: Secondary | ICD-10-CM

## 2014-05-03 LAB — BASIC METABOLIC PANEL
BUN: 20 mg/dL (ref 6–23)
CO2: 26 mEq/L (ref 19–32)
Calcium: 9.3 mg/dL (ref 8.4–10.5)
Chloride: 103 mEq/L (ref 96–112)
Creat: 0.9 mg/dL (ref 0.50–1.10)
GLUCOSE: 104 mg/dL — AB (ref 70–99)
POTASSIUM: 4 meq/L (ref 3.5–5.3)
Sodium: 137 mEq/L (ref 135–145)

## 2014-05-03 NOTE — Addendum Note (Signed)
Addended by: Brett Canales on: 05/03/2014 11:40 AM   Modules accepted: Orders

## 2014-05-05 ENCOUNTER — Other Ambulatory Visit: Payer: BC Managed Care – PPO | Admitting: Internal Medicine

## 2014-05-09 ENCOUNTER — Encounter: Payer: Self-pay | Admitting: Internal Medicine

## 2014-05-09 ENCOUNTER — Ambulatory Visit (INDEPENDENT_AMBULATORY_CARE_PROVIDER_SITE_OTHER): Payer: BC Managed Care – PPO | Admitting: Internal Medicine

## 2014-05-09 VITALS — BP 146/82 | HR 64 | Wt 164.0 lb

## 2014-05-09 DIAGNOSIS — Z1211 Encounter for screening for malignant neoplasm of colon: Secondary | ICD-10-CM

## 2014-05-09 DIAGNOSIS — I10 Essential (primary) hypertension: Secondary | ICD-10-CM

## 2014-05-09 LAB — HEMOCCULT GUIAC POC 1CARD (OFFICE)
Card #2 Fecal Occult Blod, POC: NEGATIVE
Card #3 Fecal Occult Blood, POC: NEGATIVE
FECAL OCCULT BLD: NEGATIVE

## 2014-05-09 MED ORDER — LOSARTAN POTASSIUM 100 MG PO TABS: 100.0000 mg | ORAL_TABLET | Freq: Every day | ORAL | Status: DC

## 2014-05-09 NOTE — Progress Notes (Signed)
   Subjective:    Patient ID: [name], female    DOB: 04-29-1941, 73 y.o.   MRN: [id number]  HPI in today to followup on hypertension. Last visit was started on losartan 50 mg daily in addition to HCTZ and metoprolol 50 mg daily. Blood pressure initially in the office was 146/82. It was rechecked in right arm and got 140/70 and again in the left arm 138/70. Patient says she has long-standing history of hypertension which dates back to young adulthood and tends to be labile. Feels well on current dose of losartan added to the other medications. Says that there are a lot of projects around home that husband needs to get started on but he works full-time and doesn't seem to get around to them and this is frustrating to her.    Review of Systems     Objective:   Physical Exam  Chest clear to auscultation. Cardiac exam regular rate and rhythm. Extremities without edema.      Assessment & Plan:  Hypertension  Plan: Increase losartan to 100 mg daily, continue metoprolol to milligrams daily and HCTZ 25 mg daily. Return in 2 weeks for office visit blood pressure check and basic metabolic panel. Consider adding amlodipine if necessary

## 2014-05-09 NOTE — Patient Instructions (Signed)
Increase Losartan to 100 mg daily. Continue HCTZ and Metoprolol. RTC in 2 weeks.

## 2014-05-09 NOTE — Addendum Note (Signed)
Addended by: Brett Canales on: 05/09/2014 05:01 PM   Modules accepted: Orders

## 2014-05-25 ENCOUNTER — Other Ambulatory Visit: Payer: Self-pay | Admitting: Internal Medicine

## 2014-05-27 ENCOUNTER — Encounter: Payer: Self-pay | Admitting: Internal Medicine

## 2014-05-27 ENCOUNTER — Ambulatory Visit (INDEPENDENT_AMBULATORY_CARE_PROVIDER_SITE_OTHER): Payer: BC Managed Care – PPO | Admitting: Internal Medicine

## 2014-05-27 VITALS — BP 136/78 | HR 60 | Wt 164.0 lb

## 2014-05-27 DIAGNOSIS — I10 Essential (primary) hypertension: Secondary | ICD-10-CM

## 2014-05-27 NOTE — Progress Notes (Signed)
   Subjective:    Patient ID: [name], female    DOB: 06-Dec-1940, 73 y.o.   MRN: [id number]  HPI For repeat blood pressure check. She's now on losartan 100 mg daily, HCTZ, metoprolol 50 mg daily. Sometimes when she stands up quickly or turns around quickly she feels a bit dizzy.Marland Kitchen She brings in multiple blood pressure readings since mid July. Blood pressure at home has been running 563 to 875I systolically and 43-32 diastolically   Review of Systems     Objective:   Physical Exam  Blood pressure lying today is 130/80, standing 130/80, sitting 130/80      Assessment & Plan:  Hypertension  History of hyperlipidemia  History of hypothyroidism  Anxiety  Plan: We will continue on this regimen for now. Basic metabolic panel drawn. Return in 6 months for physical exam. Advised not to change positions too quickly.

## 2014-05-27 NOTE — Patient Instructions (Signed)
Blood pressure has improved. Continue same meds and return in 6 months for physical exam. Try the diet exercise and lose weight.

## 2014-05-28 LAB — BASIC METABOLIC PANEL
BUN: 24 mg/dL — ABNORMAL HIGH (ref 6–23)
CO2: 24 mEq/L (ref 19–32)
Calcium: 9.4 mg/dL (ref 8.4–10.5)
Chloride: 105 mEq/L (ref 96–112)
Creat: 0.75 mg/dL (ref 0.50–1.10)
Glucose, Bld: 91 mg/dL (ref 70–99)
Potassium: 4.2 mEq/L (ref 3.5–5.3)
Sodium: 137 mEq/L (ref 135–145)

## 2014-06-24 ENCOUNTER — Other Ambulatory Visit: Payer: Self-pay | Admitting: Internal Medicine

## 2014-07-20 ENCOUNTER — Ambulatory Visit
Admission: RE | Admit: 2014-07-20 | Discharge: 2014-07-20 | Disposition: A | Discharge status: Home / Self Care | Payer: BC Managed Care – PPO | Source: Ambulatory Visit | Attending: Internal Medicine | Admitting: Internal Medicine

## 2014-07-20 DIAGNOSIS — Z0000 Encounter for general adult medical examination without abnormal findings: Secondary | ICD-10-CM

## 2014-07-23 ENCOUNTER — Other Ambulatory Visit: Payer: Self-pay | Admitting: Internal Medicine

## 2014-07-27 ENCOUNTER — Ambulatory Visit (INDEPENDENT_AMBULATORY_CARE_PROVIDER_SITE_OTHER): Payer: BC Managed Care – PPO | Admitting: Internal Medicine

## 2014-07-27 DIAGNOSIS — Z23 Encounter for immunization: Secondary | ICD-10-CM

## 2014-08-20 ENCOUNTER — Other Ambulatory Visit: Payer: Self-pay | Admitting: Internal Medicine

## 2014-08-30 ENCOUNTER — Other Ambulatory Visit: Payer: Self-pay | Admitting: Internal Medicine

## 2014-09-04 ENCOUNTER — Other Ambulatory Visit: Payer: Self-pay | Admitting: Internal Medicine

## 2014-09-12 ENCOUNTER — Other Ambulatory Visit: Payer: Self-pay | Admitting: Internal Medicine

## 2015-01-03 ENCOUNTER — Other Ambulatory Visit: Payer: BLUE CROSS/BLUE SHIELD | Admitting: Internal Medicine

## 2015-01-03 DIAGNOSIS — Z1329 Encounter for screening for other suspected endocrine disorder: Secondary | ICD-10-CM

## 2015-01-03 DIAGNOSIS — Z13220 Encounter for screening for lipoid disorders: Secondary | ICD-10-CM

## 2015-01-03 DIAGNOSIS — Z130 Encounter for screening for diseases of the blood and blood-forming organs and certain disorders involving the immune mechanism: Secondary | ICD-10-CM

## 2015-01-03 DIAGNOSIS — Z0000 Encounter for general adult medical examination without abnormal findings: Secondary | ICD-10-CM

## 2015-01-03 DIAGNOSIS — Z1321 Encounter for screening for nutritional disorder: Secondary | ICD-10-CM

## 2015-01-03 LAB — LIPID PANEL
Cholesterol: 191 mg/dL (ref 0–200)
HDL: 58 mg/dL (ref >=46)
LDL CALC: 110 mg/dL — AB (ref 0–99)
Total CHOL/HDL Ratio: 3.3 Ratio
Triglycerides: 113 mg/dL (ref <150)
VLDL: 23 mg/dL (ref 0–40)

## 2015-01-03 LAB — CBC WITH DIFFERENTIAL/PLATELET
BASOS PCT: 1 % (ref 0–1)
Basophils Absolute: 0.1 10*3/uL (ref 0.0–0.1)
Eosinophils Absolute: 0.1 10*3/uL (ref 0.0–0.7)
Eosinophils Relative: 1 % (ref 0–5)
HCT: 43.9 % (ref 36.0–46.0)
Hemoglobin: 14.3 g/dL (ref 12.0–15.0)
LYMPHS ABS: 2 10*3/uL (ref 0.7–4.0)
LYMPHS PCT: 35 % (ref 12–46)
MCH: 28.3 pg (ref 26.0–34.0)
MCHC: 32.6 g/dL (ref 30.0–36.0)
MCV: 86.8 fL (ref 78.0–100.0)
MONO ABS: 0.5 10*3/uL (ref 0.1–1.0)
MONOS PCT: 8 % (ref 3–12)
MPV: 10.2 fL (ref 8.6–12.4)
Neutro Abs: 3.1 10*3/uL (ref 1.7–7.7)
Neutrophils Relative %: 55 % (ref 43–77)
Platelets: 279 10*3/uL (ref 150–400)
RBC: 5.06 MIL/uL (ref 3.87–5.11)
RDW: 15 % (ref 11.5–15.5)
WBC: 5.7 10*3/uL (ref 4.0–10.5)

## 2015-01-03 LAB — COMPLETE METABOLIC PANEL WITH GFR
ALBUMIN: 4.5 g/dL (ref 3.5–5.2)
ALT: 14 U/L (ref 0–35)
AST: 16 U/L (ref 0–37)
Alkaline Phosphatase: 64 U/L (ref 39–117)
BUN: 23 mg/dL (ref 6–23)
CO2: 25 mEq/L (ref 19–32)
Calcium: 10.1 mg/dL (ref 8.4–10.5)
Chloride: 105 mEq/L (ref 96–112)
Creat: 0.78 mg/dL (ref 0.50–1.10)
GFR, EST AFRICAN AMERICAN: 86 mL/min
GFR, EST NON AFRICAN AMERICAN: 75 mL/min
Glucose, Bld: 98 mg/dL (ref 70–99)
Potassium: 4.8 mEq/L (ref 3.5–5.3)
Sodium: 141 mEq/L (ref 135–145)
Total Bilirubin: 0.6 mg/dL (ref 0.2–1.2)
Total Protein: 7.1 g/dL (ref 6.0–8.3)

## 2015-01-03 LAB — TSH: TSH: 0.408 u[IU]/mL (ref 0.350–4.500)

## 2015-01-04 LAB — VITAMIN D 25 HYDROXY (VIT D DEFICIENCY, FRACTURES): Vit D, 25-Hydroxy: 33 ng/mL (ref 30–100)

## 2015-01-05 ENCOUNTER — Telehealth: Payer: Self-pay | Admitting: *Deleted

## 2015-01-05 ENCOUNTER — Ambulatory Visit (INDEPENDENT_AMBULATORY_CARE_PROVIDER_SITE_OTHER): Payer: BLUE CROSS/BLUE SHIELD | Admitting: Internal Medicine

## 2015-01-05 ENCOUNTER — Encounter: Payer: Self-pay | Admitting: Internal Medicine

## 2015-01-05 VITALS — BP 122/84 | HR 68 | Temp 97.8°F | Ht 63.0 in | Wt 164.0 lb

## 2015-01-05 DIAGNOSIS — E039 Hypothyroidism, unspecified: Secondary | ICD-10-CM

## 2015-01-05 DIAGNOSIS — I10 Essential (primary) hypertension: Secondary | ICD-10-CM

## 2015-01-05 DIAGNOSIS — F411 Generalized anxiety disorder: Secondary | ICD-10-CM

## 2015-01-05 DIAGNOSIS — M8580 Other specified disorders of bone density and structure, unspecified site: Secondary | ICD-10-CM

## 2015-01-05 DIAGNOSIS — R0989 Other specified symptoms and signs involving the circulatory and respiratory systems: Secondary | ICD-10-CM | POA: Diagnosis not present

## 2015-01-05 DIAGNOSIS — E785 Hyperlipidemia, unspecified: Secondary | ICD-10-CM

## 2015-01-05 DIAGNOSIS — G4700 Insomnia, unspecified: Secondary | ICD-10-CM | POA: Diagnosis not present

## 2015-01-05 DIAGNOSIS — Z0000 Encounter for general adult medical examination without abnormal findings: Secondary | ICD-10-CM

## 2015-01-05 LAB — POCT URINALYSIS DIPSTICK
Bilirubin, UA: NEGATIVE
Blood, UA: NEGATIVE
GLUCOSE UA: NEGATIVE
Ketones, UA: NEGATIVE
Leukocytes, UA: NEGATIVE
NITRITE UA: NEGATIVE
PROTEIN UA: NEGATIVE
Spec Grav, UA: 1.015
Urobilinogen, UA: NEGATIVE
pH, UA: 6

## 2015-01-05 NOTE — Patient Instructions (Signed)
Continue same medications and return in 6 months. To have Prevnar in the next few weeks. 3 Hemoccult cards given.

## 2015-01-05 NOTE — Progress Notes (Signed)
Subjective:    Patient ID: [name], female    DOB: [birth year], 74 y.o.   MRN: [id number]  HPI  74 year old white female in today for health maintenance exam and evaluation of medical issues. At last physical exam March 2015 she had not been taking statin medication on a regular basis. She says now that she is taking statin medication on a regular basis. She was also not taking thyroid replacement on a regular basis at that time. Now she says she is taking thyroid replacement. She has a history of anxiety and insomnia as well as hypertension, hypothyroidism, hyperlipidemia.  She has a remote history of migraine headaches. History of vitamin D deficiency.  Past medical history: Hysterectomy in 2002 with bilateral oophorectomy. History of hypertension treated with metoprolol and HCTZ. Patient had gout of the left wrist 2009. History of ophthalmic migraine 1993. Pneumonia 2003.  Zostavax vaccine 2011. Pneumococcal 23 vaccine April 2010.  Social history: She is married and completed high school. She is retired. Does not smoke or consume alcohol. 2 adult sons.  Patient has never had colonoscopy. Declines colonoscopy today. Was given 3 Hemoccult cards to return to this office.  History of left carotid bruit. 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  She used to take estrogen replacement.  Admits to not exercising very much.  Family history: Father died at age 55 of cancer. He had a history of MI. Mother with history of hypertension. Sister with history of hypertension.    Review of Systems  Constitutional: Negative.   HENT: Negative.        Bilateral hearing loss. Wears bilateral hearing aids.  Respiratory: Negative.   Cardiovascular: Negative for chest pain, palpitations and leg swelling.  Gastrointestinal: Negative.   Genitourinary: Negative.   Neurological: Negative.    Hematological: Negative.   Psychiatric/Behavioral:       History of anxiety and insomnia       Objective:   Physical Exam  Constitutional: She appears well-developed and well-nourished. No distress.  HENT:  Head: Normocephalic and atraumatic.  Right Ear: External ear normal.  Left Ear: External ear normal.  Mouth/Throat: Oropharynx is clear and moist. No oropharyngeal exudate.  Bilateral hearing aids  Eyes: Conjunctivae and EOM are normal. Pupils are equal, round, and reactive to light. Right eye exhibits no discharge. Left eye exhibits no discharge. No scleral icterus.  Neck: Neck supple. No JVD present. No thyromegaly present.  Cardiovascular: Normal rate, regular rhythm, normal heart sounds and intact distal pulses.   No murmur heard. Pulmonary/Chest: Effort normal and breath sounds normal. No respiratory distress. She has no wheezes. She has no rales. She exhibits no tenderness.  Abdominal: Soft. Bowel sounds are normal. She exhibits no distension and no mass. There is no tenderness. There is no rebound and no guarding.  Genitourinary:  Deferred status post total abdominal hysterectomy BSO 2002  Musculoskeletal: Normal range of motion. She exhibits no edema.  Lymphadenopathy:    She has no cervical adenopathy.  Neurological: She is alert. She has normal reflexes. No cranial nerve deficit. Coordination normal.  Skin: Skin is warm and dry. No rash noted. She is not diaphoretic.  Psychiatric: She has a normal mood and affect. Her behavior is normal. Judgment and thought content normal.  Vitals reviewed.         Assessment & Plan:  Bilateral hearing aids due to  bilateral hearing loss  Hypertension-stable on current regimen  Hyperlipidemia-stable on statin medication  Hypothyroidism-TSH within normal limits  History of anxiety  History of insomnia  Osteopenia-had bone density study in 2002 showing T score of -1.7 and LS-spine and femoral neck T score -2.3. Should  have repeat bone density study.  History of left carotid bruit-probably should have repeat carotid Doppler studies in the near future.  Plan: Needs Prevnar 23. We will contact her in the near future regarding

## 2015-01-05 NOTE — Addendum Note (Signed)
Addended by: Leota Jacobsen on: 01/05/2015 04:12 PM   Modules accepted: Orders

## 2015-01-05 NOTE — Telephone Encounter (Signed)
Scheduled patient for Carotid Doppler study. Appt time is 01/12/15 at 1:15 pm at [facility]. Patient notified

## 2015-01-12 ENCOUNTER — Ambulatory Visit (HOSPITAL_COMMUNITY): Payer: BLUE CROSS/BLUE SHIELD | Attending: Cardiology | Admitting: Cardiology

## 2015-01-12 DIAGNOSIS — I6523 Occlusion and stenosis of bilateral carotid arteries: Secondary | ICD-10-CM | POA: Diagnosis not present

## 2015-01-12 DIAGNOSIS — R0989 Other specified symptoms and signs involving the circulatory and respiratory systems: Secondary | ICD-10-CM | POA: Insufficient documentation

## 2015-01-12 NOTE — Progress Notes (Signed)
Carotid duplex performed 

## 2015-06-11 ENCOUNTER — Other Ambulatory Visit: Payer: Self-pay | Admitting: Internal Medicine

## 2015-07-01 ENCOUNTER — Other Ambulatory Visit: Payer: Self-pay | Admitting: Internal Medicine

## 2015-07-03 ENCOUNTER — Other Ambulatory Visit: Payer: Self-pay | Admitting: Internal Medicine

## 2015-07-05 ENCOUNTER — Other Ambulatory Visit: Payer: Self-pay | Admitting: Internal Medicine

## 2015-07-11 ENCOUNTER — Other Ambulatory Visit: Payer: BLUE CROSS/BLUE SHIELD | Admitting: Internal Medicine

## 2015-07-11 DIAGNOSIS — Z79899 Other long term (current) drug therapy: Secondary | ICD-10-CM

## 2015-07-11 DIAGNOSIS — E039 Hypothyroidism, unspecified: Secondary | ICD-10-CM

## 2015-07-11 DIAGNOSIS — E785 Hyperlipidemia, unspecified: Secondary | ICD-10-CM

## 2015-07-11 LAB — HEPATIC FUNCTION PANEL
ALBUMIN: 4.4 g/dL (ref 3.6–5.1)
ALK PHOS: 56 U/L (ref 33–130)
ALT: 14 U/L (ref 6–29)
AST: 17 U/L (ref 10–35)
BILIRUBIN DIRECT: 0.1 mg/dL (ref <=0.2)
BILIRUBIN TOTAL: 0.6 mg/dL (ref 0.2–1.2)
Indirect Bilirubin: 0.5 mg/dL (ref 0.2–1.2)
Total Protein: 6.8 g/dL (ref 6.1–8.1)

## 2015-07-11 LAB — TSH: TSH: 0.155 u[IU]/mL — ABNORMAL LOW (ref 0.350–4.500)

## 2015-07-11 LAB — LIPID PANEL
Cholesterol: 162 mg/dL (ref 125–200)
HDL: 63 mg/dL (ref >=46)
LDL CALC: 77 mg/dL (ref <130)
Total CHOL/HDL Ratio: 2.6 Ratio (ref <=5.0)
Triglycerides: 111 mg/dL (ref <150)
VLDL: 22 mg/dL (ref <30)

## 2015-07-13 ENCOUNTER — Encounter: Payer: Self-pay | Admitting: Internal Medicine

## 2015-07-13 ENCOUNTER — Ambulatory Visit (INDEPENDENT_AMBULATORY_CARE_PROVIDER_SITE_OTHER): Payer: BLUE CROSS/BLUE SHIELD | Admitting: Internal Medicine

## 2015-07-13 VITALS — BP 120/84 | HR 72 | Temp 97.7°F | Ht 63.0 in | Wt 160.0 lb

## 2015-07-13 DIAGNOSIS — I10 Essential (primary) hypertension: Secondary | ICD-10-CM

## 2015-07-13 DIAGNOSIS — E785 Hyperlipidemia, unspecified: Secondary | ICD-10-CM | POA: Diagnosis not present

## 2015-07-13 DIAGNOSIS — E039 Hypothyroidism, unspecified: Secondary | ICD-10-CM | POA: Diagnosis not present

## 2015-07-13 DIAGNOSIS — Z23 Encounter for immunization: Secondary | ICD-10-CM

## 2015-07-13 MED ORDER — LEVOTHYROXINE SODIUM 75 MCG PO TABS: 75.0000 ug | ORAL_TABLET | Freq: Every day | ORAL | Status: DC

## 2015-07-13 NOTE — Patient Instructions (Signed)
To have TSH and office visit in 10 weeks on new dose of levothyroxine. Physical exam due after 01/05/2016. Continue same anti-hypertensive and lipid-lowering medications.

## 2015-07-13 NOTE — Progress Notes (Signed)
   Subjective:    Patient ID: [name], female    DOB: Oct 18, 1940, 74 y.o.   MRN: [id number]  HPI  For 6 month recheck on HTN, hyperlipidemia and hypothyroidism. Blood pressure stable on current regimen consisting of losartan and metoprolol and HCTZ. TSH is low on levothyroxine 0.088 mg daily. Dose will be decreased to 0.075 mg daily. She doesn't feel any differently. Flu vaccine given. No new complaints or problems. Feels okay.    Review of Systems     Objective:   Physical Exam  Constitutional: She appears well-developed and well-nourished. No distress.  Neck: Neck supple. No JVD present. No thyromegaly present.  Cardiovascular: Normal rate, regular rhythm and normal heart sounds.   No murmur heard. Pulmonary/Chest: Effort normal and breath sounds normal. No respiratory distress. She has no wheezes. She has no rales.  Musculoskeletal: She exhibits no edema.  Lymphadenopathy:    She has no cervical adenopathy.  Skin: Skin is warm and dry. She is not diaphoretic.  Psychiatric: She has a normal mood and affect. Her behavior is normal. Judgment and thought content normal.  Vitals reviewed.         Assessment & Plan:  Hypothyroidism-decrease levothyroxine to 0.075 mg daily and recheck in 10 weeks with office visit  Essential hypertension-stable on 3 drug regimen. No changes  Hyperlipidemia-lipid panel liver functions were normal on statin medication  Plan: To have recheck on hypothyroidism in 10 weeks. Otherwise CPE due in 6 months after 01/05/2016

## 2015-07-21 ENCOUNTER — Telehealth: Payer: Self-pay | Admitting: *Deleted

## 2015-07-21 ENCOUNTER — Other Ambulatory Visit: Payer: Self-pay | Admitting: *Deleted

## 2015-07-21 DIAGNOSIS — Z0000 Encounter for general adult medical examination without abnormal findings: Secondary | ICD-10-CM

## 2015-07-21 LAB — POC HEMOCCULT BLD/STL (HOME/3-CARD/SCREEN)
Card #2 Fecal Occult Blod, POC: NEGATIVE
FECAL OCCULT BLD: NEGATIVE
FECAL OCCULT BLD: NEGATIVE

## 2015-07-21 NOTE — Telephone Encounter (Signed)
Patient given hemoccult cards to do at home received cards back .  Cards neg for blood documented in results

## 2015-07-31 ENCOUNTER — Other Ambulatory Visit: Payer: Self-pay | Admitting: Internal Medicine

## 2015-08-31 ENCOUNTER — Other Ambulatory Visit: Payer: Self-pay | Admitting: Internal Medicine

## 2015-09-18 ENCOUNTER — Other Ambulatory Visit: Payer: BLUE CROSS/BLUE SHIELD | Admitting: Internal Medicine

## 2015-09-18 DIAGNOSIS — E039 Hypothyroidism, unspecified: Secondary | ICD-10-CM

## 2015-09-18 LAB — TSH: TSH: 1.404 u[IU]/mL (ref 0.350–4.500)

## 2015-09-21 ENCOUNTER — Encounter: Payer: Self-pay | Admitting: Internal Medicine

## 2015-09-21 ENCOUNTER — Ambulatory Visit (INDEPENDENT_AMBULATORY_CARE_PROVIDER_SITE_OTHER): Payer: BLUE CROSS/BLUE SHIELD | Admitting: Internal Medicine

## 2015-09-21 VITALS — BP 130/80 | HR 65 | Temp 98.0°F | Resp 20 | Ht 63.0 in | Wt 164.0 lb

## 2015-09-21 DIAGNOSIS — I10 Essential (primary) hypertension: Secondary | ICD-10-CM | POA: Diagnosis not present

## 2015-09-21 DIAGNOSIS — Z658 Other specified problems related to psychosocial circumstances: Secondary | ICD-10-CM | POA: Diagnosis not present

## 2015-09-21 DIAGNOSIS — F439 Reaction to severe stress, unspecified: Secondary | ICD-10-CM

## 2015-09-21 DIAGNOSIS — F411 Generalized anxiety disorder: Secondary | ICD-10-CM | POA: Diagnosis not present

## 2015-09-21 DIAGNOSIS — E039 Hypothyroidism, unspecified: Secondary | ICD-10-CM

## 2015-09-21 DIAGNOSIS — Z23 Encounter for immunization: Secondary | ICD-10-CM

## 2015-09-21 NOTE — Patient Instructions (Signed)
Continue levothyroxin at 0.075 mg daily. Return for physical exam in 6 months. Take Xanax as needed for anxiety. Prevnar given today.

## 2015-09-21 NOTE — Progress Notes (Signed)
   Subjective:    Patient ID: [name], female    DOB: [birth year], 74 y.o.   MRN: [id number]  HPI Patient in today to follow-up on hypothyroidism. At last visit in October for six-month recheck, TSH was low. We adjusted levothyroxin 20.075 milligrams daily from 0.088 mg daily. TSH is now within normal limits in the 1.4 range which is excellent. She is upset because her sister intact is has been ill with pneumonia. She is tearful in the office today. Has Xanax as needed for anxiety. Blood pressure was initially elevated on arrival at 160/80. After talking with her in calming her down it reduced to 130/80.    Review of Systems as above.     Objective:   Physical Exam  Skin warm and dry. Nodes none. Neck is supple without JVD thyromegaly or carotid bruits. Chest clear to auscultation. Cardiac exam regular rate and rhythm normal S1 and S2. Extremities without edema. Affect is tearful but improved after we talked for a while      Assessment & Plan:  Hypothyroidism-TSH now normal on Synthroid 0.075 mg daily. Continue same dose. Follow-up with physical exam in 6 months  Essential hypertension-stable. Elevates with anxiety  Anxiety-spent 25 minutes with patient addressing concerns about her sister, giving support with regard to anxiety and situational stress and examining patient and addressing thyroid and hypertension  Situational stress-patient has Xanax to take if needed  Health maintenance-patient requesting Prevnar 13 which was given today

## 2015-10-10 ENCOUNTER — Other Ambulatory Visit: Payer: Self-pay | Admitting: Internal Medicine

## 2015-10-17 ENCOUNTER — Other Ambulatory Visit: Payer: Self-pay | Admitting: Internal Medicine

## 2015-12-15 ENCOUNTER — Other Ambulatory Visit: Payer: Self-pay | Admitting: Internal Medicine

## 2016-02-06 ENCOUNTER — Other Ambulatory Visit: Payer: BLUE CROSS/BLUE SHIELD | Admitting: Internal Medicine

## 2016-02-06 DIAGNOSIS — E039 Hypothyroidism, unspecified: Secondary | ICD-10-CM

## 2016-02-06 DIAGNOSIS — I10 Essential (primary) hypertension: Secondary | ICD-10-CM

## 2016-02-06 DIAGNOSIS — Z0000 Encounter for general adult medical examination without abnormal findings: Secondary | ICD-10-CM

## 2016-02-06 DIAGNOSIS — E785 Hyperlipidemia, unspecified: Secondary | ICD-10-CM

## 2016-02-06 DIAGNOSIS — E559 Vitamin D deficiency, unspecified: Secondary | ICD-10-CM

## 2016-02-06 DIAGNOSIS — Z130 Encounter for screening for diseases of the blood and blood-forming organs and certain disorders involving the immune mechanism: Secondary | ICD-10-CM

## 2016-02-06 LAB — CBC WITH DIFFERENTIAL/PLATELET
BASOS ABS: 0 {cells}/uL (ref 0–200)
Basophils Relative: 0 %
Eosinophils Absolute: 59 cells/uL (ref 15–500)
Eosinophils Relative: 1 %
HEMATOCRIT: 45.2 % — AB (ref 35.0–45.0)
Hemoglobin: 15.2 g/dL (ref 11.7–15.5)
LYMPHS PCT: 28 %
Lymphs Abs: 1652 cells/uL (ref 850–3900)
MCH: 29 pg (ref 27.0–33.0)
MCHC: 33.6 g/dL (ref 32.0–36.0)
MCV: 86.1 fL (ref 80.0–100.0)
MPV: 10.2 fL (ref 7.5–12.5)
Monocytes Absolute: 590 cells/uL (ref 200–950)
Monocytes Relative: 10 %
NEUTROS PCT: 61 %
Neutro Abs: 3599 cells/uL (ref 1500–7800)
Platelets: 288 10*3/uL (ref 140–400)
RBC: 5.25 MIL/uL — AB (ref 3.80–5.10)
RDW: 15 % (ref 11.0–15.0)
WBC: 5.9 10*3/uL (ref 3.8–10.8)

## 2016-02-06 LAB — COMPLETE METABOLIC PANEL WITH GFR
ALBUMIN: 4.4 g/dL (ref 3.6–5.1)
ALK PHOS: 61 U/L (ref 33–130)
ALT: 19 U/L (ref 6–29)
AST: 20 U/L (ref 10–35)
BUN: 21 mg/dL (ref 7–25)
CALCIUM: 9.6 mg/dL (ref 8.6–10.4)
CHLORIDE: 102 mmol/L (ref 98–110)
CO2: 25 mmol/L (ref 20–31)
Creat: 0.98 mg/dL — ABNORMAL HIGH (ref 0.60–0.93)
GFR, EST NON AFRICAN AMERICAN: 56 mL/min — AB (ref >=60)
GFR, Est African American: 64 mL/min (ref >=60)
Glucose, Bld: 97 mg/dL (ref 65–99)
POTASSIUM: 4.2 mmol/L (ref 3.5–5.3)
SODIUM: 138 mmol/L (ref 135–146)
Total Bilirubin: 0.5 mg/dL (ref 0.2–1.2)
Total Protein: 6.9 g/dL (ref 6.1–8.1)

## 2016-02-06 LAB — LIPID PANEL
CHOL/HDL RATIO: 3.7 ratio (ref <=5.0)
CHOLESTEROL: 214 mg/dL — AB (ref 125–200)
HDL: 58 mg/dL (ref >=46)
LDL Cholesterol: 122 mg/dL (ref <130)
Triglycerides: 169 mg/dL — ABNORMAL HIGH (ref <150)
VLDL: 34 mg/dL — AB (ref <30)

## 2016-02-06 LAB — TSH: TSH: 3.91 mIU/L

## 2016-02-07 LAB — VITAMIN D 25 HYDROXY (VIT D DEFICIENCY, FRACTURES): VIT D 25 HYDROXY: 37 ng/mL (ref 30–100)

## 2016-02-09 ENCOUNTER — Ambulatory Visit (INDEPENDENT_AMBULATORY_CARE_PROVIDER_SITE_OTHER): Payer: BLUE CROSS/BLUE SHIELD | Admitting: Internal Medicine

## 2016-02-09 ENCOUNTER — Encounter: Payer: Self-pay | Admitting: Internal Medicine

## 2016-02-09 VITALS — BP 136/80 | HR 72 | Temp 98.1°F | Resp 12 | Ht 63.0 in | Wt 167.0 lb

## 2016-02-09 DIAGNOSIS — E039 Hypothyroidism, unspecified: Secondary | ICD-10-CM

## 2016-02-09 DIAGNOSIS — H9193 Unspecified hearing loss, bilateral: Secondary | ICD-10-CM | POA: Diagnosis not present

## 2016-02-09 DIAGNOSIS — F411 Generalized anxiety disorder: Secondary | ICD-10-CM

## 2016-02-09 DIAGNOSIS — E785 Hyperlipidemia, unspecified: Secondary | ICD-10-CM | POA: Diagnosis not present

## 2016-02-09 DIAGNOSIS — G4700 Insomnia, unspecified: Secondary | ICD-10-CM

## 2016-02-09 DIAGNOSIS — I10 Essential (primary) hypertension: Secondary | ICD-10-CM | POA: Diagnosis not present

## 2016-02-09 DIAGNOSIS — N390 Urinary tract infection, site not specified: Secondary | ICD-10-CM | POA: Diagnosis not present

## 2016-02-09 DIAGNOSIS — Z0000 Encounter for general adult medical examination without abnormal findings: Secondary | ICD-10-CM | POA: Diagnosis not present

## 2016-02-09 LAB — POCT URINALYSIS DIPSTICK
BILIRUBIN UA: NEGATIVE
Blood, UA: NEGATIVE
Glucose, UA: NEGATIVE
KETONES UA: NEGATIVE
LEUKOCYTES UA: NEGATIVE
NITRITE UA: NEGATIVE
PH UA: 6.5
PROTEIN UA: NEGATIVE
Spec Grav, UA: 1.015
Urobilinogen, UA: 0.2

## 2016-03-04 NOTE — Progress Notes (Signed)
   Subjective:    Patient ID: [name], female    DOB: 1941/04/23, 75 y.o.   MRN: [id number]  HPI 75 year old Female in today for health maintenance exam and evaluation of medical issues. She has a history of hypertension, hypothyroidism and hyperlipidemia. History of anxiety and insomnia. She has a remote history of migraine headaches and history of vitamin D deficiency.  Past medical history: Hysterectomy in 2002 with bilateral oophorectomy. History of hypertension treated with metoprolol and HCTZ. Patient had gout of the left wrist in 2009. History of ophthalmic migraine 1993. Pneumonia 2003.  Zostavax vaccine 2011. Pneumococcal 23 vaccine April 2010.  Social history: She is married and completed high school. She is retired. Does not smoke or consume alcohol. 2 adult sons.  Has never had colonoscopy. Has declined colonoscopy.  History of left carotid bruit. 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  Family history: Father died at age 67 of cancer. He had a history of MI. Mother with history of hypertension. Sister with history of hypertension.    Review of Systems history of hearing loss and wears bilateral hearing aids. History of anxiety and insomnia. Otherwise negative.     Objective:   Physical Exam  Constitutional: She is oriented to person, place, and time. She appears well-developed and well-nourished. No distress.  HENT:  Head: Normocephalic and atraumatic.  Right Ear: External ear normal.  Left Ear: External ear normal.  Mouth/Throat: Oropharynx is clear and moist.  Eyes: Conjunctivae and EOM are normal. Pupils are equal, round, and reactive to light. Right eye exhibits no discharge. Left eye exhibits no discharge. No scleral icterus.  Neck: Neck supple. No JVD present. No thyromegaly present.  Soft left carotid bruit  Cardiovascular: Normal rate, regular  rhythm and normal heart sounds.   No murmur heard. Pulmonary/Chest: Effort normal and breath sounds normal. No respiratory distress. She has no wheezes. She has no rales.  Abdominal: Soft. Bowel sounds are normal. She exhibits no distension and no mass. There is no tenderness. There is no rebound and no guarding.  Genitourinary:  Deferred status post total abdominal hysterectomy BSO 2002  Musculoskeletal: She exhibits no edema.  Lymphadenopathy:    She has no cervical adenopathy.  Neurological: She is alert and oriented to person, place, and time. No cranial nerve deficit. Coordination normal.  Skin: Skin is warm and dry. No rash noted. She is not diaphoretic.  Psychiatric: She has a normal mood and affect. Her behavior is normal. Judgment and thought content normal.  Vitals reviewed.         Assessment & Plan:  Bilateral hearing loss-wears bilateral hearing aids  Essential hypertension-stable on 3 drug regimen  Hyperlipidemia-total cholesterol 214 with triglycerides of 169. These were normal previously several months ago. Patient reportedly taking simvastatin 20 mg daily  Hypothyroidism-TSH normal on thyroid replacement  History of vitamin D deficiency-vitamin D level is normal  Left carotid bruit-asymptomatic  Plan: Return in 6 months or as needed. Continue same medications. Work on diet and exercise.  Subjective:   Patient currently not on Medicare    Objective:      Assessment:     Plan:

## 2016-03-04 NOTE — Patient Instructions (Signed)
Continue same medications and return in 6 months. Work on diet and exercise. 

## 2016-03-10 ENCOUNTER — Other Ambulatory Visit: Payer: Self-pay | Admitting: Internal Medicine

## 2016-04-01 ENCOUNTER — Other Ambulatory Visit: Payer: Self-pay | Admitting: Internal Medicine

## 2016-06-09 ENCOUNTER — Other Ambulatory Visit: Payer: Self-pay | Admitting: Internal Medicine

## 2016-06-10 ENCOUNTER — Other Ambulatory Visit: Payer: Self-pay | Admitting: Internal Medicine

## 2016-07-19 ENCOUNTER — Other Ambulatory Visit: Payer: Self-pay | Admitting: Internal Medicine

## 2016-07-19 DIAGNOSIS — Z1231 Encounter for screening mammogram for malignant neoplasm of breast: Secondary | ICD-10-CM

## 2016-07-23 ENCOUNTER — Ambulatory Visit
Admission: RE | Admit: 2016-07-23 | Discharge: 2016-07-23 | Disposition: A | Discharge status: Home / Self Care | Payer: PPO | Source: Ambulatory Visit | Attending: Internal Medicine | Admitting: Internal Medicine

## 2016-07-23 DIAGNOSIS — Z1231 Encounter for screening mammogram for malignant neoplasm of breast: Secondary | ICD-10-CM | POA: Diagnosis not present

## 2016-07-26 ENCOUNTER — Other Ambulatory Visit: Payer: Self-pay | Admitting: Internal Medicine

## 2016-08-13 ENCOUNTER — Other Ambulatory Visit: Payer: PPO | Admitting: Internal Medicine

## 2016-08-13 DIAGNOSIS — E785 Hyperlipidemia, unspecified: Secondary | ICD-10-CM | POA: Diagnosis not present

## 2016-08-13 DIAGNOSIS — E039 Hypothyroidism, unspecified: Secondary | ICD-10-CM

## 2016-08-13 DIAGNOSIS — I10 Essential (primary) hypertension: Secondary | ICD-10-CM

## 2016-08-13 DIAGNOSIS — N951 Menopausal and female climacteric states: Secondary | ICD-10-CM

## 2016-08-13 LAB — LIPID PANEL
CHOLESTEROL: 188 mg/dL (ref <200)
HDL: 67 mg/dL (ref >50)
LDL CALC: 100 mg/dL — AB
TRIGLYCERIDES: 103 mg/dL (ref <150)
Total CHOL/HDL Ratio: 2.8 Ratio (ref <5.0)
VLDL: 21 mg/dL (ref <30)

## 2016-08-13 LAB — HEPATIC FUNCTION PANEL
ALBUMIN: 4.3 g/dL (ref 3.6–5.1)
ALT: 13 U/L (ref 6–29)
AST: 17 U/L (ref 10–35)
Alkaline Phosphatase: 50 U/L (ref 33–130)
BILIRUBIN DIRECT: 0.1 mg/dL (ref <=0.2)
Indirect Bilirubin: 0.4 mg/dL (ref 0.2–1.2)
Total Bilirubin: 0.5 mg/dL (ref 0.2–1.2)
Total Protein: 6.4 g/dL (ref 6.1–8.1)

## 2016-08-13 LAB — BASIC METABOLIC PANEL
BUN: 17 mg/dL (ref 7–25)
CALCIUM: 9.8 mg/dL (ref 8.6–10.4)
CHLORIDE: 103 mmol/L (ref 98–110)
CO2: 24 mmol/L (ref 20–31)
Creat: 0.92 mg/dL (ref 0.60–0.93)
Glucose, Bld: 99 mg/dL (ref 65–99)
Potassium: 4.4 mmol/L (ref 3.5–5.3)
Sodium: 140 mmol/L (ref 135–146)

## 2016-08-13 LAB — TSH: TSH: 1.41 mIU/L

## 2016-08-13 NOTE — Addendum Note (Signed)
Addended by: Westley Hummer B on: 08/13/2016 11:27 AM   Modules accepted: Orders

## 2016-08-16 ENCOUNTER — Encounter: Payer: Self-pay | Admitting: Internal Medicine

## 2016-08-16 ENCOUNTER — Ambulatory Visit (INDEPENDENT_AMBULATORY_CARE_PROVIDER_SITE_OTHER): Payer: PPO | Admitting: Internal Medicine

## 2016-08-16 VITALS — BP 130/70 | HR 61 | Temp 97.8°F | Wt 166.0 lb

## 2016-08-16 DIAGNOSIS — H6693 Otitis media, unspecified, bilateral: Secondary | ICD-10-CM

## 2016-08-16 DIAGNOSIS — R0989 Other specified symptoms and signs involving the circulatory and respiratory systems: Secondary | ICD-10-CM | POA: Diagnosis not present

## 2016-08-16 DIAGNOSIS — E782 Mixed hyperlipidemia: Secondary | ICD-10-CM

## 2016-08-16 DIAGNOSIS — F5101 Primary insomnia: Secondary | ICD-10-CM | POA: Diagnosis not present

## 2016-08-16 DIAGNOSIS — H903 Sensorineural hearing loss, bilateral: Secondary | ICD-10-CM

## 2016-08-16 DIAGNOSIS — F411 Generalized anxiety disorder: Secondary | ICD-10-CM | POA: Diagnosis not present

## 2016-08-16 DIAGNOSIS — I10 Essential (primary) hypertension: Secondary | ICD-10-CM

## 2016-08-16 DIAGNOSIS — J069 Acute upper respiratory infection, unspecified: Secondary | ICD-10-CM

## 2016-08-16 DIAGNOSIS — E039 Hypothyroidism, unspecified: Secondary | ICD-10-CM

## 2016-08-16 MED ORDER — IPRATROPIUM BROMIDE 0.06 % NA SOLN: 2.0000 | Freq: Three times a day (TID) | NASAL | Status: AC

## 2016-08-16 MED ORDER — IPRATROPIUM BROMIDE 0.03 % NA SOLN: 2.0000 | Freq: Two times a day (BID) | NASAL | 12 refills | Status: DC

## 2016-08-16 MED ORDER — HYDROCODONE-HOMATROPINE 5-1.5 MG/5ML PO SYRP: 5.0000 mL | ORAL_SOLUTION | Freq: Three times a day (TID) | ORAL | 0 refills | Status: DC | PRN

## 2016-08-16 MED ORDER — AZITHROMYCIN 250 MG PO TABS: ORAL_TABLET | ORAL | 0 refills | Status: DC

## 2016-08-16 NOTE — Progress Notes (Signed)
   Subjective:    Patient ID: [name], female    DOB: 07/19/1941, 75 y.o.   MRN: [id number] Low HPI 75 year old Female with URI symptoms for 3 days. No fever or chills. Has cough and congestion.   Medical issues include hypertension, hypothyroidism and hyperlipidemia. History of anxiety and insomnia. History of vitamin D deficiency. Remote history of migraine headaches.    Review of Systems see above regarding URI symptoms Complains of significant malaise and fatigue with respiratory infection    Objective:   Physical Exam She sounds hoarse and congested and is coughing a lot in the office. TMs are clear. Pharynx slightly injected. Neck is supple without significant adenopathy. Chest clear to auscultation       Assessment & Plan:  Acute URI  Hypothyroidism  Hyperlipidemia  Hypertension  Anxiety  Insomnia  Plan: Lipid panel liver functions are normal. TSH is normal. Basic metabolic panel is normal.     For respiratory infection, Hycodan 1 teaspoon by mouth every 12 hours when necessary cough. Zithromax Z-PAK take 2 tablets day one followed by 1 tablet days 2 through 5. Continue Cozaar, metoprolol and HCTZ for hypertension. Continue 0.075 mg levothyroxine for hypothyroidism. Continue Zocor 20 mg daily for hyperlipidemia. Continued take exam ask at bedtime as needed. Return in 6 months for physical exam.

## 2016-08-16 NOTE — Patient Instructions (Addendum)
Zithromax Z-PAK take as directed. Atrovent nasal spray as directed. Hycodan 1 teaspoon by mouth every 8 hours when necessary cough. Defer flu vaccine for 2 weeks. Work on diet exercise and weight loss. Physical exam due in 6 months. Continue other medicines as previously prescribed

## 2016-09-03 ENCOUNTER — Ambulatory Visit (INDEPENDENT_AMBULATORY_CARE_PROVIDER_SITE_OTHER): Payer: PPO | Admitting: Internal Medicine

## 2016-09-03 DIAGNOSIS — Z23 Encounter for immunization: Secondary | ICD-10-CM

## 2016-09-03 NOTE — Progress Notes (Signed)
   Subjective:    Patient ID: [name], female    DOB: 1941-09-27, 75 y.o.   MRN: [id number]  HPI Flu vaccine given    Review of Systems     Objective:   Physical Exam        Assessment & Plan:

## 2016-09-03 NOTE — Patient Instructions (Signed)
Flu vaccine given.

## 2016-10-05 ENCOUNTER — Other Ambulatory Visit: Payer: Self-pay | Admitting: Internal Medicine

## 2016-10-08 ENCOUNTER — Other Ambulatory Visit: Payer: Self-pay | Admitting: Internal Medicine

## 2016-11-14 DIAGNOSIS — H524 Presbyopia: Secondary | ICD-10-CM | POA: Diagnosis not present

## 2016-12-07 ENCOUNTER — Other Ambulatory Visit: Payer: Self-pay | Admitting: Internal Medicine

## 2016-12-09 NOTE — Telephone Encounter (Signed)
Refill x 6 months 

## 2017-01-07 ENCOUNTER — Other Ambulatory Visit: Payer: Self-pay | Admitting: Internal Medicine

## 2017-02-11 ENCOUNTER — Other Ambulatory Visit: Payer: PPO | Admitting: Internal Medicine

## 2017-02-11 DIAGNOSIS — E039 Hypothyroidism, unspecified: Secondary | ICD-10-CM | POA: Diagnosis not present

## 2017-02-11 DIAGNOSIS — Z0000 Encounter for general adult medical examination without abnormal findings: Secondary | ICD-10-CM

## 2017-02-11 DIAGNOSIS — I10 Essential (primary) hypertension: Secondary | ICD-10-CM | POA: Diagnosis not present

## 2017-02-11 DIAGNOSIS — E2839 Other primary ovarian failure: Secondary | ICD-10-CM | POA: Diagnosis not present

## 2017-02-11 LAB — COMPREHENSIVE METABOLIC PANEL
ALK PHOS: 54 U/L (ref 33–130)
ALT: 13 U/L (ref 6–29)
AST: 19 U/L (ref 10–35)
Albumin: 4.3 g/dL (ref 3.6–5.1)
BUN: 17 mg/dL (ref 7–25)
CHLORIDE: 104 mmol/L (ref 98–110)
CO2: 27 mmol/L (ref 20–31)
CREATININE: 0.93 mg/dL (ref 0.60–0.93)
Calcium: 9.7 mg/dL (ref 8.6–10.4)
Glucose, Bld: 108 mg/dL — ABNORMAL HIGH (ref 65–99)
Potassium: 4.1 mmol/L (ref 3.5–5.3)
SODIUM: 141 mmol/L (ref 135–146)
Total Bilirubin: 0.6 mg/dL (ref 0.2–1.2)
Total Protein: 6.7 g/dL (ref 6.1–8.1)

## 2017-02-11 LAB — CBC WITH DIFFERENTIAL/PLATELET
BASOS ABS: 55 {cells}/uL (ref 0–200)
Basophils Relative: 1 %
EOS PCT: 1 %
Eosinophils Absolute: 55 cells/uL (ref 15–500)
HCT: 45.5 % — ABNORMAL HIGH (ref 35.0–45.0)
HEMOGLOBIN: 14.9 g/dL (ref 11.7–15.5)
LYMPHS ABS: 1705 {cells}/uL (ref 850–3900)
Lymphocytes Relative: 31 %
MCH: 28.5 pg (ref 27.0–33.0)
MCHC: 32.7 g/dL (ref 32.0–36.0)
MCV: 87 fL (ref 80.0–100.0)
MPV: 10.2 fL (ref 7.5–12.5)
Monocytes Absolute: 495 cells/uL (ref 200–950)
Monocytes Relative: 9 %
NEUTROS PCT: 58 %
Neutro Abs: 3190 cells/uL (ref 1500–7800)
PLATELETS: 277 10*3/uL (ref 140–400)
RBC: 5.23 MIL/uL — ABNORMAL HIGH (ref 3.80–5.10)
RDW: 15 % (ref 11.0–15.0)
WBC: 5.5 10*3/uL (ref 3.8–10.8)

## 2017-02-11 LAB — LIPID PANEL
CHOLESTEROL: 163 mg/dL (ref <200)
HDL: 70 mg/dL (ref >50)
LDL Cholesterol: 72 mg/dL (ref <100)
Total CHOL/HDL Ratio: 2.3 Ratio (ref <5.0)
Triglycerides: 104 mg/dL (ref <150)
VLDL: 21 mg/dL (ref <30)

## 2017-02-11 LAB — TSH: TSH: 1.25 mIU/L

## 2017-02-12 LAB — VITAMIN D 25 HYDROXY (VIT D DEFICIENCY, FRACTURES): Vit D, 25-Hydroxy: 44 ng/mL (ref 30–100)

## 2017-02-14 ENCOUNTER — Encounter: Payer: PPO | Admitting: Internal Medicine

## 2017-03-11 ENCOUNTER — Ambulatory Visit (INDEPENDENT_AMBULATORY_CARE_PROVIDER_SITE_OTHER): Payer: PPO | Admitting: Internal Medicine

## 2017-03-11 ENCOUNTER — Encounter: Payer: Self-pay | Admitting: Internal Medicine

## 2017-03-11 VITALS — BP 122/76 | HR 66 | Temp 97.9°F | Ht 62.5 in | Wt 168.0 lb

## 2017-03-11 DIAGNOSIS — H9193 Unspecified hearing loss, bilateral: Secondary | ICD-10-CM | POA: Diagnosis not present

## 2017-03-11 DIAGNOSIS — E782 Mixed hyperlipidemia: Secondary | ICD-10-CM | POA: Diagnosis not present

## 2017-03-11 DIAGNOSIS — I10 Essential (primary) hypertension: Secondary | ICD-10-CM | POA: Diagnosis not present

## 2017-03-11 DIAGNOSIS — Z6830 Body mass index (BMI) 30.0-30.9, adult: Secondary | ICD-10-CM | POA: Diagnosis not present

## 2017-03-11 DIAGNOSIS — R0989 Other specified symptoms and signs involving the circulatory and respiratory systems: Secondary | ICD-10-CM

## 2017-03-11 DIAGNOSIS — F411 Generalized anxiety disorder: Secondary | ICD-10-CM

## 2017-03-11 DIAGNOSIS — G4700 Insomnia, unspecified: Secondary | ICD-10-CM | POA: Diagnosis not present

## 2017-03-11 DIAGNOSIS — Z0000 Encounter for general adult medical examination without abnormal findings: Secondary | ICD-10-CM | POA: Diagnosis not present

## 2017-03-11 DIAGNOSIS — E039 Hypothyroidism, unspecified: Secondary | ICD-10-CM | POA: Diagnosis not present

## 2017-03-11 LAB — POCT URINALYSIS DIPSTICK
BILIRUBIN UA: NEGATIVE
Blood, UA: NEGATIVE
GLUCOSE UA: NEGATIVE
KETONES UA: NEGATIVE
LEUKOCYTES UA: NEGATIVE
Nitrite, UA: NEGATIVE
Protein, UA: NEGATIVE
Spec Grav, UA: 1.015 (ref 1.010–1.025)
Urobilinogen, UA: 0.2 E.U./dL
pH, UA: 6 (ref 5.0–8.0)

## 2017-03-11 NOTE — Patient Instructions (Signed)
It was a pleasure to see you today. Continue same medications. Watch sweets. Return in 6 months.

## 2017-03-11 NOTE — Progress Notes (Signed)
Subjective:    Patient ID: [name], female    DOB: 11-15-1940, 76 y.o.   MRN: [id number]  HPI  76 year old White Female For Medicare wellness exam, health maintenance exam and evaluation of medical problems.She has a history of hypertension, hypothyroidism, hyperlipidemia, anxiety, insomnia. She has a remote history of migraine headaches. Remote history of vitamin D deficiency.  Past medical history: Hysterectomy in 2002 with bilateral oophorectomy. History of hypertension treated with metoprolol and HCTZ. She had gout of the left wrist in 2009. History of ophthalmic migraine 1993. Pneumonia 2003.  Zostavax vaccine 2011. Pneumococcal 23 vaccine April 2010. Prevnar 13 vaccine 2016.  Social history: She is married and completed high school. She is retired. Does not smoke or consume alcohol. 2 adult sons.  Has declined colonoscopy.  History of left carotid bruit. 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  Family history: Father died at age 14 of cancer. He had a history of MI. Mother with history of hypertension. Sister with history of hypertension.        Review of Systems history of hearing loss and wears bilateral hearing aids.     Objective:   Physical Exam  Constitutional: She is oriented to person, place, and time. She appears well-developed and well-nourished. No distress.  HENT:  Head: Normocephalic and atraumatic.  Right Ear: External ear normal.  Left Ear: External ear normal.  Mouth/Throat: Oropharynx is clear and moist. No oropharyngeal exudate.  Eyes: Conjunctivae are normal. Pupils are equal, round, and reactive to light. Right eye exhibits no discharge. Left eye exhibits no discharge.  Neck: Neck supple. No JVD present. No thyromegaly present.  Cardiovascular: Normal rate, regular rhythm, normal heart sounds and intact distal pulses.   No  murmur heard. Pulmonary/Chest: Effort normal and breath sounds normal. No respiratory distress. She has no wheezes. She has no rales.  Breasts normal female without masses  Abdominal: Soft. Bowel sounds are normal. She exhibits no distension and no mass. There is no tenderness. There is no rebound and no guarding.  Musculoskeletal: She exhibits no edema.  Lymphadenopathy:    She has no cervical adenopathy.  Neurological: She is alert and oriented to person, place, and time. She has normal reflexes. No cranial nerve deficit. Coordination normal.  Skin: Skin is warm and dry. No rash noted. She is not diaphoretic.  Psychiatric: She has a normal mood and affect. Her behavior is normal. Judgment and thought content normal.  Vitals reviewed.         Assessment & Plan:  Normal health maintenance exam  Bilateral hearing loss-one has bilateral hearing aids. Small amount of cerumen in left external ear canal. She will have audiologist evaluate that.  Essential hypertension-stable on 3 drug regimen  Hyperlipidemia-on statin medication and within normal limits  Hypothyroidism-on thyroid replacement medication  History of vitamin D deficiency-level is normal on oral vitamin D supplement  Asymptomatic left carotid bruit  Elevated serum glucose-cannot be added to lab work which was done in early May. Offered to redraw today but she wants to wait until six-month recheck visit.  Plan: Return in 6 months. Continue same medications. Work on diet exercise and weight loss. Check hemoglobin A1c at next visit due to elevated serum   glucose. Consider repeating carotid Dopplers-discuss at next visit.  Subjective:   Patient presents for Medicare Annual/Subsequent preventive examination.  Review Past  Medical/Family/Social:See above   Risk Factors  Current exercise habits: Light exercise Dietary issues discussed: Low fat low carbohydrate  Cardiac risk factors:Hyperlipidemia, family  history  Depression Screen  (Note: if answer to either of the following is "Yes", a more complete depression screening is indicated)   Over the past two weeks, have you felt down, depressed or hopeless? No  Over the past two weeks, have you felt little interest or pleasure in doing things? No Have you lost interest or pleasure in daily life? No Do you often feel hopeless? No Do you cry easily over simple problems? Yes-always high  Activities of Daily Living  In your present state of health, do you have any difficulty performing the following activities?:   Driving? No  Managing money? No  Feeding yourself? No  Getting from bed to chair? No  Climbing a flight of stairs? No  Preparing food and eating?: No  Bathing or showering? No  Getting dressed: No  Getting to the toilet? No  Using the toilet:No  Moving around from place to place: No  In the past year have you fallen or had a near fall?:No  Are you sexually active? No  Do you have more than one partner? No   Hearing Difficulties: No  Do you often ask people to speak up or repeat themselves? No  Do you experience ringing or noises in your ears? No  Do you have difficulty understanding soft or whispered voices? Yes due to hearing loss Do you feel that you have a problem with memory? No Do you often misplace items? No    Home Safety:  Do you have a smoke alarm at your residence? Yes Do you have grab bars in the bathroom?No Do you have throw rugs in your house? Yes   Cognitive Testing  Alert? Yes Normal Appearance?Yes  Oriented to person? Yes Place? Yes  Time? Yes  Recall of three objects? Yes  Can perform simple calculations? Yes  Displays appropriate judgment?Yes  Can read the correct time from a watch face?Yes   List the Names of Other Physician/Practitioners you currently use:  See referral list for the physicians patient is currently seeing.     Review of Systems: See above   Objective:     General  appearance: Appears stated age and mildly obese  Head: Normocephalic, without obvious abnormality, atraumatic  Eyes: conj clear, EOMi PEERLA  Ears: normal TM's and external ear canals both ears  Nose: Nares normal. Septum midline. Mucosa normal. No drainage or sinus tenderness. Wears bilateral hearing aids Throat: lips, mucosa, and tongue normal; teeth and gums normal  Neck: no adenopathy, no carotid bruit, no JVD, supple, symmetrical, trachea midline and thyroid not enlarged, symmetric, no tenderness/mass/nodules  No CVA tenderness.  Lungs: clear to auscultation bilaterally  Breasts: normal appearance, no masses or tenderness,  Heart: regular rate and rhythm, S1, S2 normal, no murmur, click, rub or gallop  Abdomen: soft, non-tender; bowel sounds normal; no masses, no organomegaly  Musculoskeletal: ROM normal in all joints, no crepitus, no deformity, Normal muscle strengthen. Back  is symmetric, no curvature. Skin: Skin color, texture, turgor normal. No rashes or lesions  Lymph nodes: Cervical, supraclavicular, and axillary nodes normal.  Neurologic: CN 2 -12 Normal, Normal symmetric reflexes. Normal coordination and gait  Psych: Alert & Oriented x 3, Mood appear stable.    Assessment:    Annual wellness medicare exam   Plan:    During the course of the visit the patient was  educated and counseled about appropriate screening and preventive services including:   Annual mammogram  Annual flu vaccine     Patient Instructions (the written plan) was given to the patient.  Medicare Attestation  I have personally reviewed:  The patient's medical and social history  Their use of alcohol, tobacco or illicit drugs  Their current medications and supplements  The patient's functional ability including ADLs,fall risks, home safety risks, cognitive, and hearing and visual impairment  Diet and physical activities  Evidence for depression or mood disorders  The patient's weight, height,  BMI, and visual acuity have been recorded in the chart. I have made referrals, counseling, and provided education to the patient based on review of the above and I have provided the patient with a written personalized care plan for preventive services.

## 2017-04-07 ENCOUNTER — Other Ambulatory Visit: Payer: Self-pay | Admitting: Internal Medicine

## 2017-06-05 ENCOUNTER — Other Ambulatory Visit: Payer: Self-pay | Admitting: Internal Medicine

## 2017-06-06 ENCOUNTER — Other Ambulatory Visit: Payer: Self-pay | Admitting: Internal Medicine

## 2017-07-05 ENCOUNTER — Other Ambulatory Visit: Payer: Self-pay | Admitting: Internal Medicine

## 2017-08-07 DIAGNOSIS — H903 Sensorineural hearing loss, bilateral: Secondary | ICD-10-CM | POA: Diagnosis not present

## 2017-09-09 ENCOUNTER — Other Ambulatory Visit: Payer: PPO | Admitting: Internal Medicine

## 2017-09-10 ENCOUNTER — Other Ambulatory Visit: Payer: Self-pay | Admitting: Internal Medicine

## 2017-09-10 DIAGNOSIS — E785 Hyperlipidemia, unspecified: Secondary | ICD-10-CM

## 2017-09-10 DIAGNOSIS — E039 Hypothyroidism, unspecified: Secondary | ICD-10-CM

## 2017-09-10 DIAGNOSIS — R7302 Impaired glucose tolerance (oral): Secondary | ICD-10-CM

## 2017-09-10 DIAGNOSIS — I10 Essential (primary) hypertension: Secondary | ICD-10-CM

## 2017-09-11 ENCOUNTER — Ambulatory Visit: Payer: PPO | Admitting: Internal Medicine

## 2017-09-15 ENCOUNTER — Other Ambulatory Visit: Payer: PPO | Admitting: Internal Medicine

## 2017-09-17 ENCOUNTER — Ambulatory Visit: Payer: PPO | Admitting: Internal Medicine

## 2017-09-22 ENCOUNTER — Other Ambulatory Visit: Payer: PPO | Admitting: Internal Medicine

## 2017-09-22 DIAGNOSIS — E785 Hyperlipidemia, unspecified: Secondary | ICD-10-CM | POA: Diagnosis not present

## 2017-09-22 DIAGNOSIS — E039 Hypothyroidism, unspecified: Secondary | ICD-10-CM | POA: Diagnosis not present

## 2017-09-22 DIAGNOSIS — I10 Essential (primary) hypertension: Secondary | ICD-10-CM

## 2017-09-22 DIAGNOSIS — R7302 Impaired glucose tolerance (oral): Secondary | ICD-10-CM | POA: Diagnosis not present

## 2017-09-22 NOTE — Addendum Note (Signed)
Addended by: Mady Haagensen on: 09/22/2017 09:21 AM   Modules accepted: Orders

## 2017-09-22 NOTE — Addendum Note (Signed)
Addended by: Mady Haagensen on: 09/22/2017 09:39 AM   Modules accepted: Orders

## 2017-09-22 NOTE — Addendum Note (Signed)
Addended by: Mady Haagensen on: 09/22/2017 09:20 AM   Modules accepted: Orders

## 2017-09-23 LAB — MICROALBUMIN / CREATININE URINE RATIO
Creatinine, Urine: 239 mg/dL (ref 20–275)
MICROALB/CREAT RATIO: 5 ug/mg{creat} (ref <30)
Microalb, Ur: 1.3 mg/dL

## 2017-09-23 LAB — HEPATIC FUNCTION PANEL
AG Ratio: 1.8 (calc) (ref 1.0–2.5)
ALKALINE PHOSPHATASE (APISO): 51 U/L (ref 33–130)
ALT: 15 U/L (ref 6–29)
AST: 18 U/L (ref 10–35)
Albumin: 4.4 g/dL (ref 3.6–5.1)
BILIRUBIN INDIRECT: 0.4 mg/dL (ref 0.2–1.2)
Bilirubin, Direct: 0.1 mg/dL (ref 0.0–0.2)
GLOBULIN: 2.4 g/dL (ref 1.9–3.7)
TOTAL PROTEIN: 6.8 g/dL (ref 6.1–8.1)
Total Bilirubin: 0.5 mg/dL (ref 0.2–1.2)

## 2017-09-23 LAB — BASIC METABOLIC PANEL
BUN / CREAT RATIO: 28 (calc) — AB (ref 6–22)
BUN: 27 mg/dL — ABNORMAL HIGH (ref 7–25)
CO2: 27 mmol/L (ref 20–32)
CREATININE: 0.98 mg/dL — AB (ref 0.60–0.93)
Calcium: 9.9 mg/dL (ref 8.6–10.4)
Chloride: 103 mmol/L (ref 98–110)
GLUCOSE: 107 mg/dL — AB (ref 65–99)
Potassium: 4.3 mmol/L (ref 3.5–5.3)
Sodium: 139 mmol/L (ref 135–146)

## 2017-09-23 LAB — LIPID PANEL
CHOL/HDL RATIO: 3.7 (calc) (ref <5.0)
CHOLESTEROL: 239 mg/dL — AB (ref <200)
HDL: 64 mg/dL (ref >50)
LDL Cholesterol (Calc): 149 mg/dL (calc) — ABNORMAL HIGH
Non-HDL Cholesterol (Calc): 175 mg/dL (calc) — ABNORMAL HIGH (ref <130)
TRIGLYCERIDES: 139 mg/dL (ref <150)

## 2017-09-23 LAB — HEMOGLOBIN A1C
EAG (MMOL/L): 6.3 (calc)
Hgb A1c MFr Bld: 5.6 % of total Hgb (ref <5.7)
Mean Plasma Glucose: 114 (calc)

## 2017-09-23 LAB — TSH: TSH: 7.6 mIU/L — ABNORMAL HIGH (ref 0.40–4.50)

## 2017-09-24 ENCOUNTER — Encounter: Payer: Self-pay | Admitting: Internal Medicine

## 2017-09-24 ENCOUNTER — Ambulatory Visit: Payer: PPO | Admitting: Internal Medicine

## 2017-09-24 VITALS — BP 160/82 | HR 75 | Ht 62.5 in | Wt 165.0 lb

## 2017-09-24 DIAGNOSIS — G4700 Insomnia, unspecified: Secondary | ICD-10-CM

## 2017-09-24 DIAGNOSIS — I10 Essential (primary) hypertension: Secondary | ICD-10-CM | POA: Diagnosis not present

## 2017-09-24 DIAGNOSIS — E7800 Pure hypercholesterolemia, unspecified: Secondary | ICD-10-CM | POA: Diagnosis not present

## 2017-09-24 DIAGNOSIS — R7989 Other specified abnormal findings of blood chemistry: Secondary | ICD-10-CM

## 2017-09-24 DIAGNOSIS — H903 Sensorineural hearing loss, bilateral: Secondary | ICD-10-CM

## 2017-09-24 DIAGNOSIS — E039 Hypothyroidism, unspecified: Secondary | ICD-10-CM

## 2017-09-24 DIAGNOSIS — R0989 Other specified symptoms and signs involving the circulatory and respiratory systems: Secondary | ICD-10-CM | POA: Diagnosis not present

## 2017-09-24 NOTE — Progress Notes (Signed)
   Subjective:    Patient ID: [name], female    DOB: March 14, 1941, 76 y.o.   MRN: [id number]  HPI 76 year old Female for 6 month recheck. Not exercising as much. Stressed over Christmas. BP elevated today. Taking BP meds correctly but feeling stressed over upcoming trip to [facility].  She has a history of hypertension, hypothyroidism, hyperlipidemia, anxiety and insomnia.  She is retired.  Says husband is considering retiring after the first of the year.  History of left carotid bruit.  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  Family history of MI father.  For some reason her TSH is elevated at 7.60.  She does not think she missed any doses but 7 months ago was 1.25 Hemoglobin A1c is 5.6% and within normal limits.  Lipid panel shows total cholesterol of 239 with an LDL of 149 with normal triglycerides and HDL of 64.  Liver functions are normal.  Review of Systems     Objective:   Physical Exam  Constitutional: She appears well-developed and well-nourished.  Anxious  Neck: No JVD present. No thyromegaly present.  Cardiovascular: Normal rate, regular rhythm and normal heart sounds.  Left carotid bruit  Pulmonary/Chest: No respiratory distress. She has no wheezes. She has no rales.  Musculoskeletal: She exhibits no edema.  Lymphadenopathy:    She has no cervical adenopathy.  Skin: Skin is warm and dry.          Assessment & Plan:  Hypothyroidism has elevated TSH  Hyperlipidemia has elevated LDL and total cholesterol level  Essential hypertension  Anxiety related to the holidays and trip  Bilateral hearing loss  History of left carotid bruit  Plan: We will repeat these labs in late January and see how things are going.  She may have missed some doses of thyroid replacement medication and she will work on her diet.

## 2017-10-06 ENCOUNTER — Encounter: Payer: Self-pay | Admitting: Internal Medicine

## 2017-10-06 NOTE — Patient Instructions (Signed)
She will work on her diet and make sure she is taking medications and will return in late January for follow-up.

## 2017-10-15 ENCOUNTER — Other Ambulatory Visit: Payer: Self-pay | Admitting: Internal Medicine

## 2017-10-15 DIAGNOSIS — Z79899 Other long term (current) drug therapy: Secondary | ICD-10-CM

## 2017-10-15 DIAGNOSIS — E039 Hypothyroidism, unspecified: Secondary | ICD-10-CM

## 2017-10-15 DIAGNOSIS — E785 Hyperlipidemia, unspecified: Secondary | ICD-10-CM

## 2017-10-23 DIAGNOSIS — H5203 Hypermetropia, bilateral: Secondary | ICD-10-CM | POA: Diagnosis not present

## 2017-10-23 DIAGNOSIS — H2513 Age-related nuclear cataract, bilateral: Secondary | ICD-10-CM | POA: Diagnosis not present

## 2017-10-24 ENCOUNTER — Other Ambulatory Visit: Payer: Medicare HMO | Admitting: Internal Medicine

## 2017-10-24 DIAGNOSIS — E039 Hypothyroidism, unspecified: Secondary | ICD-10-CM

## 2017-10-24 DIAGNOSIS — Z79899 Other long term (current) drug therapy: Secondary | ICD-10-CM | POA: Diagnosis not present

## 2017-10-24 DIAGNOSIS — E785 Hyperlipidemia, unspecified: Secondary | ICD-10-CM | POA: Diagnosis not present

## 2017-10-24 LAB — HEPATIC FUNCTION PANEL
AG RATIO: 2.1 (calc) (ref 1.0–2.5)
ALKALINE PHOSPHATASE (APISO): 61 U/L (ref 33–130)
ALT: 12 U/L (ref 6–29)
AST: 15 U/L (ref 10–35)
Albumin: 4.4 g/dL (ref 3.6–5.1)
BILIRUBIN TOTAL: 0.5 mg/dL (ref 0.2–1.2)
Bilirubin, Direct: 0.1 mg/dL (ref 0.0–0.2)
Globulin: 2.1 g/dL (calc) (ref 1.9–3.7)
Indirect Bilirubin: 0.4 mg/dL (calc) (ref 0.2–1.2)
Total Protein: 6.5 g/dL (ref 6.1–8.1)

## 2017-10-24 LAB — LIPID PANEL
CHOLESTEROL: 183 mg/dL (ref <200)
HDL: 64 mg/dL (ref >50)
LDL CHOLESTEROL (CALC): 99 mg/dL
Non-HDL Cholesterol (Calc): 119 mg/dL (calc) (ref <130)
Total CHOL/HDL Ratio: 2.9 (calc) (ref <5.0)
Triglycerides: 107 mg/dL (ref <150)

## 2017-10-24 LAB — TSH: TSH: 4.54 mIU/L — ABNORMAL HIGH (ref 0.40–4.50)

## 2017-10-28 ENCOUNTER — Encounter: Payer: Self-pay | Admitting: Internal Medicine

## 2017-10-28 ENCOUNTER — Ambulatory Visit (INDEPENDENT_AMBULATORY_CARE_PROVIDER_SITE_OTHER): Payer: Medicare HMO | Admitting: Internal Medicine

## 2017-10-28 VITALS — BP 120/88 | HR 77 | Ht 62.5 in | Wt 162.0 lb

## 2017-10-28 DIAGNOSIS — I10 Essential (primary) hypertension: Secondary | ICD-10-CM | POA: Diagnosis not present

## 2017-10-28 DIAGNOSIS — E7800 Pure hypercholesterolemia, unspecified: Secondary | ICD-10-CM

## 2017-10-28 DIAGNOSIS — R7989 Other specified abnormal findings of blood chemistry: Secondary | ICD-10-CM | POA: Diagnosis not present

## 2017-10-28 DIAGNOSIS — E039 Hypothyroidism, unspecified: Secondary | ICD-10-CM | POA: Diagnosis not present

## 2017-10-28 MED ORDER — LEVOTHYROXINE SODIUM 88 MCG PO TABS
88.0000 ug | ORAL_TABLET | Freq: Every day | ORAL | 3 refills | Status: DC
Start: 2017-10-28 — End: 2017-12-23

## 2017-10-28 NOTE — Progress Notes (Signed)
   Subjective:    Patient ID: [name], female    DOB: [birth year], 77 y.o.   MRN: [id number]  HPI Here today to follow-up on hypertension.  At last visit blood pressure was elevated.  It was 160/82.  Today her blood pressure is 120/88.  She is also here today to follow-up on hypothyroidism.  At last visit her TSH was 7.60.  TSH is now 4.5 for a we will increase her thyroid replacement medication with plans to follow-up in 6-8 weeks.  Her lipid panel has improved.  Total cholesterol was 239 and is now 183.  LDL cholesterol was 149 and is now 99.  She had been off Zocor in December and is now back on it.    Review of Systems see above     Objective:   Physical Exam  Neck supple without thyromegaly.  Skin warm and dry.  Chest clear.  Cardiac exam regular rate and rhythm normal S1 and S2.  Left carotid bruit.  Extremities without edema.      Assessment & Plan:  Hypothyroidism-increase levothyroxine to 0.088 mg daily and follow-up in 6-8 weeks  Left carotid bruit.  Has had a carotid study in 1999 and will consider repeating that in the near future.  Hyperlipidemia-needs statin medication.  Lipid panel is now normal on this medication.  Essential hypertension-stable on current regimen  Plan: Follow-up with hypothyroidism in March with TSH and office visit.  25 minutes spent with patient

## 2017-10-29 MED ORDER — SIMVASTATIN 20 MG PO TABS: 20.0000 mg | ORAL_TABLET | Freq: Every day | ORAL | 3 refills | Status: DC

## 2017-10-29 NOTE — Patient Instructions (Signed)
Increase thyroid replacement 0.088 mg daily and follow-up in March.  Continue statin medication.  Lipid panel is normal on it.  Continue same antihypertensive medication.

## 2017-12-04 ENCOUNTER — Other Ambulatory Visit: Payer: Self-pay | Admitting: Internal Medicine

## 2017-12-22 ENCOUNTER — Other Ambulatory Visit: Payer: Medicare HMO | Admitting: Internal Medicine

## 2017-12-22 DIAGNOSIS — E039 Hypothyroidism, unspecified: Secondary | ICD-10-CM

## 2017-12-22 LAB — TSH: TSH: 0.14 mIU/L — ABNORMAL LOW (ref 0.40–4.50)

## 2017-12-23 ENCOUNTER — Ambulatory Visit
Admission: RE | Admit: 2017-12-23 | Discharge: 2017-12-23 | Disposition: A | Discharge status: Home / Self Care | Payer: Medicare HMO | Source: Ambulatory Visit | Attending: Internal Medicine | Admitting: Internal Medicine

## 2017-12-23 ENCOUNTER — Encounter: Payer: Self-pay | Admitting: Internal Medicine

## 2017-12-23 ENCOUNTER — Ambulatory Visit (INDEPENDENT_AMBULATORY_CARE_PROVIDER_SITE_OTHER): Payer: Medicare HMO | Admitting: Internal Medicine

## 2017-12-23 VITALS — BP 112/78 | HR 76 | Ht 63.0 in | Wt 163.0 lb

## 2017-12-23 DIAGNOSIS — M25561 Pain in right knee: Secondary | ICD-10-CM

## 2017-12-23 DIAGNOSIS — M1711 Unilateral primary osteoarthritis, right knee: Secondary | ICD-10-CM | POA: Diagnosis not present

## 2017-12-23 DIAGNOSIS — M25551 Pain in right hip: Secondary | ICD-10-CM

## 2017-12-23 DIAGNOSIS — E039 Hypothyroidism, unspecified: Secondary | ICD-10-CM | POA: Diagnosis not present

## 2017-12-23 DIAGNOSIS — M1611 Unilateral primary osteoarthritis, right hip: Secondary | ICD-10-CM | POA: Diagnosis not present

## 2017-12-23 MED ORDER — LEVOTHYROXINE SODIUM 75 MCG PO TABS: 75.0000 ug | ORAL_TABLET | Freq: Every day | ORAL | 0 refills | Status: DC

## 2017-12-23 NOTE — Patient Instructions (Signed)
Have x-rays of right hip and right knee.  May take Aleve 1 tablet twice daily with meals.  Decrease levothyroxine to 0.075 mg daily and follow-up with TSH in about 6 weeks.

## 2017-12-23 NOTE — Progress Notes (Deleted)
   Subjective:    Patient ID: [name], female    DOB: [birth year], 77 y.o.   MRN: [id number]  HPI    Review of Systems     Objective:   Physical Exam        Assessment & Plan:

## 2017-12-23 NOTE — Progress Notes (Signed)
   Subjective:    Patient ID: [name], female    DOB: Nov 28, 1940, 77 y.o.   MRN: [id number]  HPI Here today to follow-up on hypothyroidism.  In December TSH was elevated at 7.60 but she did not think she had missed any dosages.  We repeated TSH in January and level was 4.54.  At that point we increased her thyroid replacement from 0.075 mg daily to 0.088 mg daily.  Now it seems that TSH is too low at 0.14 mg daily.  We are going to go back to 0.075 mg daily and follow-up with her in 6 weeks.  She denies missing doses and denies taking medication with food.  Her blood pressure is excellent today at 112/78.  She is complaining of right knee and right hip pain.  These are new issues.  We will x-ray right knee and right hip.  Sometimes she takes an Aleve but does not take it on a regular basis.  Her husband retired recently.  They plan to travel more.    Review of Systems has noticed some swelling in right knee from time to time.  No falls or accidents.     Objective:   Physical Exam Chest clear.  Cardiac exam regular rate and rhythm.  No thyromegaly.  Extremities without edema.       Assessment & Plan:  Right knee pain-to have x-ray  Right hip pain-to have x-ray  Hypothyroidism-decrease dose of thyroid replacement back to 0.075 mg daily and follow-up in 6 weeks  Hypertension-blood pressure stable  Plan is to see if patient has end-stage osteoarthritis of her right hip or right knee.  She may benefit from taking Aleve twice daily every day with meals.

## 2017-12-31 ENCOUNTER — Other Ambulatory Visit: Payer: Self-pay | Admitting: Internal Medicine

## 2018-01-13 DIAGNOSIS — R69 Illness, unspecified: Secondary | ICD-10-CM | POA: Diagnosis not present

## 2018-02-02 ENCOUNTER — Ambulatory Visit (INDEPENDENT_AMBULATORY_CARE_PROVIDER_SITE_OTHER): Payer: Medicare HMO | Admitting: Internal Medicine

## 2018-02-02 ENCOUNTER — Encounter: Payer: Self-pay | Admitting: Internal Medicine

## 2018-02-02 ENCOUNTER — Other Ambulatory Visit: Payer: Medicare HMO | Admitting: Internal Medicine

## 2018-02-02 VITALS — BP 130/82 | HR 90 | Temp 98.1°F | Ht 63.0 in | Wt 164.0 lb

## 2018-02-02 DIAGNOSIS — R319 Hematuria, unspecified: Secondary | ICD-10-CM

## 2018-02-02 DIAGNOSIS — R8290 Unspecified abnormal findings in urine: Secondary | ICD-10-CM

## 2018-02-02 DIAGNOSIS — N390 Urinary tract infection, site not specified: Secondary | ICD-10-CM | POA: Diagnosis not present

## 2018-02-02 DIAGNOSIS — E039 Hypothyroidism, unspecified: Secondary | ICD-10-CM | POA: Diagnosis not present

## 2018-02-02 LAB — TSH: TSH: 0.53 mIU/L (ref 0.40–4.50)

## 2018-02-02 LAB — POCT URINALYSIS DIPSTICK
BILIRUBIN UA: NEGATIVE
GLUCOSE UA: NEGATIVE
KETONES UA: NEGATIVE
Nitrite, UA: NEGATIVE
PH UA: 6.5 (ref 5.0–8.0)
Spec Grav, UA: 1.015 (ref 1.010–1.025)
UROBILINOGEN UA: 0.2 U/dL

## 2018-02-02 MED ORDER — NITROFURANTOIN MONOHYD MACRO 100 MG PO CAPS: 100.0000 mg | ORAL_CAPSULE | Freq: Two times a day (BID) | ORAL | 0 refills | Status: DC

## 2018-02-02 NOTE — Progress Notes (Signed)
   Subjective:    Patient ID: [name], female    DOB: [birth year], 77 y.o.   MRN: [id number]  HPI patient is never had a UTI that she can recall.  She had onset of dysuria this morning.  Yesterday she had several episodes of diarrhea.  She was at a birthday lunch and ate a lot of various foods.    Review of Systems see above     Objective:   Physical Exam  No CVA tenderness.  Urine dipstick abnormal and culture was sent.  She is afebrile      Assessment & Plan:   Acute UTI  Plan: Macrobid 100 mg twice daily for 7 days.  Follow-up in 1 week.

## 2018-02-02 NOTE — Patient Instructions (Signed)
Macrobid 100 mg bid x 7 days.

## 2018-02-04 LAB — URINE CULTURE
MICRO NUMBER:: 90520342
SPECIMEN QUALITY:: ADEQUATE

## 2018-02-05 ENCOUNTER — Encounter: Payer: Self-pay | Admitting: Internal Medicine

## 2018-02-05 ENCOUNTER — Ambulatory Visit (INDEPENDENT_AMBULATORY_CARE_PROVIDER_SITE_OTHER): Payer: Medicare HMO | Admitting: Internal Medicine

## 2018-02-05 VITALS — BP 130/82 | HR 69 | Temp 98.1°F | Ht 63.0 in | Wt 167.0 lb

## 2018-02-05 DIAGNOSIS — N390 Urinary tract infection, site not specified: Secondary | ICD-10-CM | POA: Diagnosis not present

## 2018-02-05 DIAGNOSIS — E039 Hypothyroidism, unspecified: Secondary | ICD-10-CM | POA: Diagnosis not present

## 2018-02-05 DIAGNOSIS — I10 Essential (primary) hypertension: Secondary | ICD-10-CM | POA: Diagnosis not present

## 2018-02-05 LAB — POCT URINALYSIS DIPSTICK
APPEARANCE: NORMAL
Bilirubin, UA: NEGATIVE
Blood, UA: NEGATIVE
Glucose, UA: NEGATIVE
KETONES UA: NEGATIVE
LEUKOCYTES UA: NEGATIVE
NITRITE UA: NEGATIVE
ODOR: NORMAL
PH UA: 7 (ref 5.0–8.0)
PROTEIN UA: NEGATIVE
Spec Grav, UA: 1.01 (ref 1.010–1.025)
UROBILINOGEN UA: 0.2 U/dL

## 2018-02-05 NOTE — Addendum Note (Signed)
Addended by: Mady Haagensen on: 02/05/2018 11:22 AM   Modules accepted: Orders

## 2018-02-05 NOTE — Patient Instructions (Signed)
Finish course of Macrobid.  Continue levothyroxine 0.075 mg daily.  Follow-up in October for physical examination.

## 2018-02-05 NOTE — Progress Notes (Signed)
   Subjective:    Patient ID: [name], female    DOB: [birth year], 77 y.o.   MRN: [id number]  HPI 77 year old Female for follow-up on hypothyroidism.  TSH is now normal on levothyroxine 0.075 mg daily.  She was here earlier this week with a urinary tract infection and was placed on Macrobid.  Urine culture grew E. coli sensitive to Macrobid.  She is finishing up that prescription.  We are going to repeat urinalysis today.  She is feeling better.    Review of Systems See above no new complaints  Objective:   Physical Exam  Skin warm and dry.  Nodes none.  Neck supple without JVD thyromegaly or carotid bruits.  Chest clear to auscultation.  Cardiac exam regular rate and rhythm normal S1 and S2.  Extremities without edema.  Urine dipstick pending.      Assessment & Plan:  Hypothyroidism  UTI  Plan: Her physical exam was originally due in June 2019 but we will move that out until October and she is pleased with that.  Continue same dose of thyroid replacement.

## 2018-06-03 ENCOUNTER — Other Ambulatory Visit: Payer: Self-pay | Admitting: Internal Medicine

## 2018-06-04 ENCOUNTER — Other Ambulatory Visit: Payer: Self-pay | Admitting: Internal Medicine

## 2018-07-01 ENCOUNTER — Other Ambulatory Visit: Payer: Self-pay | Admitting: Internal Medicine

## 2018-07-01 DIAGNOSIS — Z0000 Encounter for general adult medical examination without abnormal findings: Secondary | ICD-10-CM

## 2018-07-01 DIAGNOSIS — I10 Essential (primary) hypertension: Secondary | ICD-10-CM

## 2018-07-01 DIAGNOSIS — H9193 Unspecified hearing loss, bilateral: Secondary | ICD-10-CM

## 2018-07-01 DIAGNOSIS — E782 Mixed hyperlipidemia: Secondary | ICD-10-CM

## 2018-07-01 DIAGNOSIS — Z6830 Body mass index (BMI) 30.0-30.9, adult: Secondary | ICD-10-CM

## 2018-07-01 DIAGNOSIS — E039 Hypothyroidism, unspecified: Secondary | ICD-10-CM

## 2018-07-01 DIAGNOSIS — R0989 Other specified symptoms and signs involving the circulatory and respiratory systems: Secondary | ICD-10-CM

## 2018-07-01 DIAGNOSIS — F411 Generalized anxiety disorder: Secondary | ICD-10-CM

## 2018-07-01 DIAGNOSIS — G4700 Insomnia, unspecified: Secondary | ICD-10-CM

## 2018-07-03 ENCOUNTER — Ambulatory Visit (INDEPENDENT_AMBULATORY_CARE_PROVIDER_SITE_OTHER): Payer: Medicare HMO | Admitting: Internal Medicine

## 2018-07-03 ENCOUNTER — Encounter: Payer: Self-pay | Admitting: Internal Medicine

## 2018-07-03 ENCOUNTER — Other Ambulatory Visit: Payer: Medicare HMO | Admitting: Internal Medicine

## 2018-07-03 VITALS — BP 140/90 | HR 90 | Temp 98.3°F | Ht 63.0 in | Wt 167.0 lb

## 2018-07-03 DIAGNOSIS — H9193 Unspecified hearing loss, bilateral: Secondary | ICD-10-CM

## 2018-07-03 DIAGNOSIS — E782 Mixed hyperlipidemia: Secondary | ICD-10-CM | POA: Diagnosis not present

## 2018-07-03 DIAGNOSIS — E039 Hypothyroidism, unspecified: Secondary | ICD-10-CM

## 2018-07-03 DIAGNOSIS — I10 Essential (primary) hypertension: Secondary | ICD-10-CM | POA: Diagnosis not present

## 2018-07-03 DIAGNOSIS — Z6830 Body mass index (BMI) 30.0-30.9, adult: Secondary | ICD-10-CM | POA: Diagnosis not present

## 2018-07-03 DIAGNOSIS — M1711 Unilateral primary osteoarthritis, right knee: Secondary | ICD-10-CM | POA: Diagnosis not present

## 2018-07-03 DIAGNOSIS — G4700 Insomnia, unspecified: Secondary | ICD-10-CM | POA: Diagnosis not present

## 2018-07-03 DIAGNOSIS — R69 Illness, unspecified: Secondary | ICD-10-CM | POA: Diagnosis not present

## 2018-07-03 DIAGNOSIS — R0989 Other specified symptoms and signs involving the circulatory and respiratory systems: Secondary | ICD-10-CM | POA: Diagnosis not present

## 2018-07-03 DIAGNOSIS — Z0000 Encounter for general adult medical examination without abnormal findings: Secondary | ICD-10-CM

## 2018-07-03 DIAGNOSIS — F411 Generalized anxiety disorder: Secondary | ICD-10-CM

## 2018-07-03 LAB — COMPLETE METABOLIC PANEL WITH GFR
AG Ratio: 1.9 (calc) (ref 1.0–2.5)
ALBUMIN MSPROF: 4.7 g/dL (ref 3.6–5.1)
ALT: 12 U/L (ref 6–29)
AST: 16 U/L (ref 10–35)
Alkaline phosphatase (APISO): 67 U/L (ref 33–130)
BILIRUBIN TOTAL: 0.6 mg/dL (ref 0.2–1.2)
BUN/Creatinine Ratio: 20 (calc) (ref 6–22)
BUN: 19 mg/dL (ref 7–25)
CO2: 25 mmol/L (ref 20–32)
CREATININE: 0.94 mg/dL — AB (ref 0.60–0.93)
Calcium: 10.6 mg/dL — ABNORMAL HIGH (ref 8.6–10.4)
Chloride: 98 mmol/L (ref 98–110)
GFR, EST NON AFRICAN AMERICAN: 58 mL/min/{1.73_m2} — AB (ref >=60)
GFR, Est African American: 67 mL/min/{1.73_m2} (ref >=60)
GLUCOSE: 103 mg/dL — AB (ref 65–99)
Globulin: 2.5 g/dL (calc) (ref 1.9–3.7)
Potassium: 4.5 mmol/L (ref 3.5–5.3)
Sodium: 135 mmol/L (ref 135–146)
Total Protein: 7.2 g/dL (ref 6.1–8.1)

## 2018-07-03 LAB — CBC WITH DIFFERENTIAL/PLATELET
BASOS PCT: 0.7 %
Basophils Absolute: 48 cells/uL (ref 0–200)
EOS ABS: 48 {cells}/uL (ref 15–500)
Eosinophils Relative: 0.7 %
HCT: 46 % — ABNORMAL HIGH (ref 35.0–45.0)
HEMOGLOBIN: 15.6 g/dL — AB (ref 11.7–15.5)
LYMPHS ABS: 1842 {cells}/uL (ref 850–3900)
MCH: 28.4 pg (ref 27.0–33.0)
MCHC: 33.9 g/dL (ref 32.0–36.0)
MCV: 83.8 fL (ref 80.0–100.0)
MPV: 10.5 fL (ref 7.5–12.5)
Monocytes Relative: 11.4 %
NEUTROS ABS: 4175 {cells}/uL (ref 1500–7800)
Neutrophils Relative %: 60.5 %
Platelets: 333 10*3/uL (ref 140–400)
RBC: 5.49 10*6/uL — ABNORMAL HIGH (ref 3.80–5.10)
RDW: 13.6 % (ref 11.0–15.0)
Total Lymphocyte: 26.7 %
WBC mixed population: 787 cells/uL (ref 200–950)
WBC: 6.9 10*3/uL (ref 3.8–10.8)

## 2018-07-03 LAB — LIPID PANEL
CHOLESTEROL: 173 mg/dL (ref <200)
HDL: 62 mg/dL (ref >50)
LDL Cholesterol (Calc): 92 mg/dL (calc)
Non-HDL Cholesterol (Calc): 111 mg/dL (calc) (ref <130)
TRIGLYCERIDES: 101 mg/dL (ref <150)
Total CHOL/HDL Ratio: 2.8 (calc) (ref <5.0)

## 2018-07-03 LAB — TSH: TSH: 1.89 m[IU]/L (ref 0.40–4.50)

## 2018-07-03 MED ORDER — MELOXICAM 15 MG PO TABS: 15.0000 mg | ORAL_TABLET | Freq: Every day | ORAL | 0 refills | Status: DC

## 2018-07-03 NOTE — Patient Instructions (Signed)
Meloxicam 15 mg daily.  Referred to orthopedist of choice.

## 2018-07-03 NOTE — Progress Notes (Signed)
   Subjective:    Patient ID: [name], female    DOB: 11/07/1940, 77 y.o.   MRN: [id number]  HPI She is scheduled for physical exam on October 1 but wanted to be seen to discuss right knee pain today.  Apparently this is been going on for some time.  She is also here for fasting labs.  Says that she would like to have a CT of her entire leg to see what is going on.  Pain starts in her right knee and extends upward into her thigh and down her lower leg.    Review of Systems see above  Flu vaccine given     Objective:   Physical Exam She has no effusion of the right knee but she has joint line tenderness and crepitus of right knee joint.  No significant tenderness along medial and lateral collateral ligaments       Assessment & Plan:  Likely osteoarthritis right knee  Plan: She will see which orthopedist her husband prefers and we will make referral.  Meloxicam 15 mg daily.  She has an appointment next week for physical examination.  Flu vaccine given today.  Fasting labs drawn in anticipation of physical exam.

## 2018-07-04 ENCOUNTER — Encounter: Payer: Self-pay | Admitting: Internal Medicine

## 2018-07-07 ENCOUNTER — Ambulatory Visit (INDEPENDENT_AMBULATORY_CARE_PROVIDER_SITE_OTHER): Payer: Medicare HMO | Admitting: Internal Medicine

## 2018-07-07 ENCOUNTER — Encounter: Payer: Self-pay | Admitting: Internal Medicine

## 2018-07-07 VITALS — BP 140/80 | HR 70 | Ht 63.0 in | Wt 160.0 lb

## 2018-07-07 DIAGNOSIS — H9193 Unspecified hearing loss, bilateral: Secondary | ICD-10-CM | POA: Diagnosis not present

## 2018-07-07 DIAGNOSIS — R0989 Other specified symptoms and signs involving the circulatory and respiratory systems: Secondary | ICD-10-CM

## 2018-07-07 DIAGNOSIS — E039 Hypothyroidism, unspecified: Secondary | ICD-10-CM

## 2018-07-07 DIAGNOSIS — M1711 Unilateral primary osteoarthritis, right knee: Secondary | ICD-10-CM

## 2018-07-07 DIAGNOSIS — R8290 Unspecified abnormal findings in urine: Secondary | ICD-10-CM | POA: Diagnosis not present

## 2018-07-07 DIAGNOSIS — Z23 Encounter for immunization: Secondary | ICD-10-CM | POA: Diagnosis not present

## 2018-07-07 DIAGNOSIS — E782 Mixed hyperlipidemia: Secondary | ICD-10-CM

## 2018-07-07 DIAGNOSIS — F419 Anxiety disorder, unspecified: Secondary | ICD-10-CM

## 2018-07-07 DIAGNOSIS — G4700 Insomnia, unspecified: Secondary | ICD-10-CM

## 2018-07-07 DIAGNOSIS — Z0000 Encounter for general adult medical examination without abnormal findings: Secondary | ICD-10-CM | POA: Diagnosis not present

## 2018-07-07 DIAGNOSIS — I10 Essential (primary) hypertension: Secondary | ICD-10-CM

## 2018-07-07 LAB — POCT URINALYSIS DIPSTICK
BILIRUBIN UA: NEGATIVE
Blood, UA: NEGATIVE
Glucose, UA: NEGATIVE
KETONES UA: NEGATIVE
NITRITE UA: NEGATIVE
PH UA: 6 (ref 5.0–8.0)
PROTEIN UA: NEGATIVE
SPEC GRAV UA: 1.015 (ref 1.010–1.025)
UROBILINOGEN UA: 0.2 U/dL

## 2018-07-07 NOTE — Progress Notes (Signed)
Subjective:    Patient ID: [name], female    DOB: November 04, 1940, 77 y.o.   MRN: [id number]  HPI 77 year old Female for Medicare wellness, health maintenance exam and evaluation of medical issues.  She has a history of hypertension, hypothyroidism, hyperlipidemia, anxiety, insomnia.  Remote history of migraine headaches.  Remote history of vitamin D deficiency.  Presented in late September with right knee pain and started on meloxicam.  Improved with this medication.  We considered orthopedic consultation but will hold off at the present time since she is improving.  Past medical history: Underwent hysterectomy in 2002 with bilateral oophorectomy.  History of hypertension treated with metoprolol and HCTZ.  She had gout of the left wrist in 2009.  History of ophthalmic migraine 1993.  Had pneumonia in 2003.  Zostavax vaccine given 2011.  Pneumococcal 23 vaccine April 2010.  Prevnar vaccine in 2016.  Social history: She is married and completed high school.  She is retired.  Does not smoke or consume alcohol.  2 adult sons.  She has declined colonoscopy.  History of left carotid bruit.  She had a carotid duplex study 1999 showing a 25 to 30% stenosis of the right internal carotid artery and a 30 to 35% stenosis in the left internal carotid artery.  Family history: Father died at age 42 of cancer.  He had history of MI.  Mother with history of hypertension.  Sister with history of hypertension.    Review of Systems  Constitutional: Negative.   HENT:       Bilateral sensorineural hearing loss and wears bilateral hearing aids  Eyes: Negative.   Respiratory: Negative.   Cardiovascular: Negative.   Gastrointestinal: Negative.   Genitourinary: Negative.   Musculoskeletal:       Painful right knee and is to see orthopedist  Neurological: Negative.   Psychiatric/Behavioral: Negative.        Objective:   Physical Exam  Constitutional: She is oriented to person, place, and time.  She appears well-developed and well-nourished. No distress.  HENT:  Head: Normocephalic.  Right Ear: External ear normal.  Left Ear: External ear normal.  Nose: Nose normal.  Mouth/Throat: Oropharynx is clear and moist.  Eyes: Right eye exhibits no discharge. Left eye exhibits no discharge. No scleral icterus.  Neck: Neck supple. No JVD present. No thyromegaly present.  Left carotid bruit  Cardiovascular: Normal rate, regular rhythm, normal heart sounds and intact distal pulses.  No murmur heard. Pulmonary/Chest: No stridor. No respiratory distress. She has no wheezes. She has no rales.  Breasts normal female without masses  Abdominal: Soft. Bowel sounds are normal. She exhibits no distension and no mass. There is no tenderness. There is no rebound and no guarding. No hernia.  Genitourinary:  Genitourinary Comments: Deferred status post TAH/BSO  Musculoskeletal: She exhibits no edema.  Lymphadenopathy:    She has no cervical adenopathy.  Neurological: She is alert and oriented to person, place, and time. She displays normal reflexes. No cranial nerve deficit or sensory deficit. She exhibits normal muscle tone. Coordination normal.  Skin: Skin is warm and dry. No rash noted. She is not diaphoretic.  Psychiatric: She has a normal mood and affect. Her behavior is normal. Judgment and thought content normal.  Vitals reviewed.         Assessment & Plan:  Essential hypertension-stable with HCTZ, metoprolol and losartan  Bilateral hearing loss treated with bilateral hearing aids  Hyperlipidemia-on statin medication and lipid panel within normal limits  as well as liver functions  Hypothyroidism-on thyroid replacement and TSH is within normal limits-continue same dose of thyroid replacement  History of vitamin D deficiency-treated with vitamin D supplement-level not checked with this visit  Asymptomatic left carotid bruit  History of insomnia treated with Xanax  Health  maintenance-flu vaccine given  Anxiety state- worries a lot has xanax for insomnia  Right knee osteoarthritis-  In late September was placed on meloxicam and improved.  Hold off on orthopedic consult.  Plan: Continue current medications and follow-up in 6 months.  Continue meloxicam.  Knee pain has improved.  Hold off on orthopedic consult.  She has small LE on urine dipstick. Culture sent  Add: culture has insignificant growth  Subjective:   Patient presents for Medicare Annual/Subsequent preventive examination.  Review Past Medical/Family/Social: See above   Risk Factors  Current exercise habits: Not much exercise with recent right knee pain Dietary issues discussed: Low-fat low carbohydrate  Cardiac risk factors: Hyperlipidemia, hypertension, family history and father  Depression Screen  (Note: if answer to either of the following is "Yes", a more complete depression screening is indicated)   Over the past two weeks, have you felt down, depressed or hopeless? No  Over the past two weeks, have you felt little interest or pleasure in doing things? No Have you lost interest or pleasure in daily life? No Do you often feel hopeless? No Do you cry easily over simple problems? No   Activities of Daily Living  In your present state of health, do you have any difficulty performing the following activities?:   Driving? Not driving Managing money? No  Feeding yourself? No  Getting from bed to chair? No  Climbing a flight of stairs?  Yes due to knee pain Preparing food and eating?: No  Bathing or showering? No  Getting dressed: No  Getting to the toilet? No  Using the toilet:No  Moving around from place to place: No  In the past year have you fallen or had a near fall?:No  Are you sexually active? No  Do you have more than one partner? No   Hearing Difficulties: Yes has bilateral hearing Do you often ask people to speak up or repeat themselves? yes Do you experience  ringing or noises in your ears? No  Do you have difficulty understanding soft or whispered voices?  yes has has hearing aids Do you feel that you have a problem with memory?  Sometimes Do you often misplace items? No    Home Safety:  Do you have a smoke alarm at your residence? Yes Do you have grab bars in the bathroom?  No Do you have throw rugs in your house?  Yes   Cognitive Testing  Alert? Yes Normal Appearance?Yes  Oriented to person? Yes Place? Yes  Time? Yes  Recall of three objects? Yes  Can perform simple calculations? Yes  Displays appropriate judgment?Yes  Can read the correct time from a watch face?Yes   List the Names of Other Physician/Practitioners you currently use:  See referral list for the physicians patient is currently seeing.     Review of Systems: See above   Objective:     General appearance: Appears stated age and mildly obese  Head: Normocephalic, without obvious abnormality, atraumatic  Eyes: conj clear, EOMi PEERLA  Ears: normal TM's and external ear canals both ears  Nose: Nares normal. Septum midline. Mucosa normal. No drainage or sinus tenderness.  Throat: lips, mucosa, and tongue normal; teeth and gums  normal  Neck: no adenopathy, no carotid bruit, no JVD, supple, symmetrical, trachea midline and thyroid not enlarged, symmetric, no tenderness/mass/nodules  No CVA tenderness.  Lungs: clear to auscultation bilaterally  Breasts: normal appearance, no masses or tenderness Heart: regular rate and rhythm, S1, S2 normal, no murmur, click, rub or gallop  Abdomen: soft, non-tender; bowel sounds normal; no masses, no organomegaly  Musculoskeletal: ROM normal in all joints, no crepitus, no deformity, Normal muscle strengthen. Back  is symmetric, no curvature. Skin: Skin color, texture, turgor normal. No rashes or lesions  Lymph nodes: Cervical, supraclavicular, and axillary nodes normal.  Neurologic: CN 2 -12 Normal, Normal symmetric reflexes.  Normal coordination and gait  Psych: Alert & Oriented x 3, Mood appear stable.    Assessment:    Annual wellness medicare exam   Plan:    During the course of the visit the patient was educated and counseled about appropriate screening and preventive services including:        Patient Instructions (the written plan) was given to the patient.  Medicare Attestation  I have personally reviewed:  The patient's medical and social history  Their use of alcohol, tobacco or illicit drugs  Their current medications and supplements  The patient's functional ability including ADLs,fall risks, home safety risks, cognitive, and hearing and visual impairment  Diet and physical activities  Evidence for depression or mood disorders  The patient's weight, height, BMI, and visual acuity have been recorded in the chart. I have made referrals, counseling, and provided education to the patient based on review of the above and I have provided the patient with a written personalized care plan for preventive services.

## 2018-07-08 LAB — URINE CULTURE
MICRO NUMBER:: 91177508
SPECIMEN QUALITY:: ADEQUATE

## 2018-07-19 NOTE — Patient Instructions (Signed)
It was a pleasure to see you today.  Continue current medications and follow-up in 6 months.  Flu vaccine given today.

## 2018-07-21 DIAGNOSIS — R69 Illness, unspecified: Secondary | ICD-10-CM | POA: Diagnosis not present

## 2018-08-03 ENCOUNTER — Other Ambulatory Visit: Payer: Self-pay | Admitting: Internal Medicine

## 2018-09-10 DIAGNOSIS — D1801 Hemangioma of skin and subcutaneous tissue: Secondary | ICD-10-CM | POA: Diagnosis not present

## 2018-09-10 DIAGNOSIS — L814 Other melanin hyperpigmentation: Secondary | ICD-10-CM | POA: Diagnosis not present

## 2018-09-10 DIAGNOSIS — Z23 Encounter for immunization: Secondary | ICD-10-CM | POA: Diagnosis not present

## 2018-09-22 DIAGNOSIS — Z0100 Encounter for examination of eyes and vision without abnormal findings: Secondary | ICD-10-CM | POA: Diagnosis not present

## 2018-09-24 ENCOUNTER — Telehealth: Payer: Self-pay | Admitting: Internal Medicine

## 2018-09-24 NOTE — Telephone Encounter (Signed)
Pharmacy called at 5:02 stating losatan 50 mg is on backorder and wanted to confirm filling 100 mg with instructions of taking half a tablet. Confirm by Dr. Renold Genta. 90 day supply for no refills so patient can return to regular Rx.

## 2018-10-29 DIAGNOSIS — H524 Presbyopia: Secondary | ICD-10-CM | POA: Diagnosis not present

## 2018-10-29 DIAGNOSIS — H5203 Hypermetropia, bilateral: Secondary | ICD-10-CM | POA: Diagnosis not present

## 2018-10-29 DIAGNOSIS — H52223 Regular astigmatism, bilateral: Secondary | ICD-10-CM | POA: Diagnosis not present

## 2018-11-30 ENCOUNTER — Other Ambulatory Visit: Payer: Self-pay | Admitting: Internal Medicine

## 2018-11-30 MED ORDER — MELOXICAM 15 MG PO TABS: 15.0000 mg | ORAL_TABLET | Freq: Every day | ORAL | 3 refills | Status: DC

## 2018-12-04 ENCOUNTER — Other Ambulatory Visit: Payer: Self-pay | Admitting: Internal Medicine

## 2018-12-16 DIAGNOSIS — Z0100 Encounter for examination of eyes and vision without abnormal findings: Secondary | ICD-10-CM | POA: Diagnosis not present

## 2018-12-18 ENCOUNTER — Other Ambulatory Visit: Payer: Self-pay | Admitting: Internal Medicine

## 2018-12-21 ENCOUNTER — Other Ambulatory Visit: Payer: Self-pay

## 2018-12-21 MED ORDER — LOSARTAN POTASSIUM 50 MG PO TABS: 50.0000 mg | ORAL_TABLET | Freq: Every day | ORAL | 0 refills | Status: DC

## 2019-01-11 ENCOUNTER — Telehealth: Payer: Self-pay | Admitting: Internal Medicine

## 2019-01-11 NOTE — Telephone Encounter (Signed)
OK to cancel until June

## 2019-01-11 NOTE — Telephone Encounter (Signed)
[name] [phone]  [name] called to cancel labs and webex for this week, she stated she does not want to come out for labs, and did not think there was a need fo webex if she did not have labs, plus she was unable to set up webex on her phone. Please advise

## 2019-01-11 NOTE — Telephone Encounter (Signed)
Rescheduled appointments to June

## 2019-01-12 ENCOUNTER — Other Ambulatory Visit: Payer: Medicare HMO | Admitting: Internal Medicine

## 2019-01-14 ENCOUNTER — Ambulatory Visit: Payer: Medicare HMO | Admitting: Internal Medicine

## 2019-01-16 ENCOUNTER — Other Ambulatory Visit: Payer: Self-pay | Admitting: Internal Medicine

## 2019-03-30 ENCOUNTER — Other Ambulatory Visit: Payer: Self-pay | Admitting: Internal Medicine

## 2019-03-30 ENCOUNTER — Other Ambulatory Visit: Payer: Medicare HMO | Admitting: Internal Medicine

## 2019-03-30 ENCOUNTER — Other Ambulatory Visit: Payer: Self-pay

## 2019-03-30 VITALS — Temp 98.0°F

## 2019-03-30 DIAGNOSIS — E782 Mixed hyperlipidemia: Secondary | ICD-10-CM

## 2019-03-30 DIAGNOSIS — Z79899 Other long term (current) drug therapy: Secondary | ICD-10-CM | POA: Diagnosis not present

## 2019-03-30 DIAGNOSIS — Z5181 Encounter for therapeutic drug level monitoring: Secondary | ICD-10-CM | POA: Diagnosis not present

## 2019-03-30 DIAGNOSIS — E039 Hypothyroidism, unspecified: Secondary | ICD-10-CM | POA: Diagnosis not present

## 2019-03-31 LAB — HEPATIC FUNCTION PANEL
AG Ratio: 2 (calc) (ref 1.0–2.5)
ALT: 12 U/L (ref 6–29)
AST: 15 U/L (ref 10–35)
Albumin: 4.2 g/dL (ref 3.6–5.1)
Alkaline phosphatase (APISO): 58 U/L (ref 37–153)
Bilirubin, Direct: 0.1 mg/dL (ref 0.0–0.2)
Globulin: 2.1 g/dL (calc) (ref 1.9–3.7)
Indirect Bilirubin: 0.5 mg/dL (calc) (ref 0.2–1.2)
Total Bilirubin: 0.6 mg/dL (ref 0.2–1.2)
Total Protein: 6.3 g/dL (ref 6.1–8.1)

## 2019-03-31 LAB — EXTRA LAV TOP TUBE

## 2019-03-31 LAB — LIPID PANEL
Cholesterol: 176 mg/dL (ref <200)
HDL: 63 mg/dL (ref >=50)
LDL Cholesterol (Calc): 89 mg/dL (calc)
Non-HDL Cholesterol (Calc): 113 mg/dL (calc) (ref <130)
Total CHOL/HDL Ratio: 2.8 (calc) (ref <5.0)
Triglycerides: 143 mg/dL (ref <150)

## 2019-03-31 LAB — TSH: TSH: 0.49 mIU/L (ref 0.40–4.50)

## 2019-03-31 LAB — HOUSE ACCOUNT TRACKING

## 2019-04-01 ENCOUNTER — Ambulatory Visit (INDEPENDENT_AMBULATORY_CARE_PROVIDER_SITE_OTHER): Payer: Medicare HMO | Admitting: Internal Medicine

## 2019-04-01 ENCOUNTER — Other Ambulatory Visit: Payer: Self-pay

## 2019-04-01 ENCOUNTER — Encounter: Payer: Self-pay | Admitting: Internal Medicine

## 2019-04-01 VITALS — BP 110/80 | HR 62 | Temp 98.2°F | Ht 63.0 in

## 2019-04-01 DIAGNOSIS — E782 Mixed hyperlipidemia: Secondary | ICD-10-CM | POA: Diagnosis not present

## 2019-04-01 DIAGNOSIS — I10 Essential (primary) hypertension: Secondary | ICD-10-CM

## 2019-04-01 DIAGNOSIS — R0989 Other specified symptoms and signs involving the circulatory and respiratory systems: Secondary | ICD-10-CM | POA: Diagnosis not present

## 2019-04-01 DIAGNOSIS — G4700 Insomnia, unspecified: Secondary | ICD-10-CM

## 2019-04-01 DIAGNOSIS — E039 Hypothyroidism, unspecified: Secondary | ICD-10-CM | POA: Diagnosis not present

## 2019-04-01 NOTE — Progress Notes (Signed)
   Subjective:    Patient ID: [name], female    DOB: 09-12-1941, 78 y.o.   MRN: [id number]  HPI 78 year old Female seen today for 34-month follow-up.  She has a history of essential hypertension treated with 3 drug regimen.  History of anxiety disorder and insomnia treated with Xanax.  Hyperlipidemia treated with simvastatin.  Hypothyroidism on thyroid replacement medication.  Doing fairly well during the pandemic.  Reviewed with her her lab results.  TSH is within normal limits at 0.49.  Liver panel on statin medication is normal and lipid panel is entirely normal.  Her HDL cholesterol is 63, triglycerides 143, total cholesterol 176 and LDL cholesterol 89.  She has no new complaints or issues.    Review of Systems no other complaints     Objective:   Physical Exam Blood pressure 110/80, pulse 62, temperature 98.2 degrees orally.  Pulse oximetry 97%.  Skin warm and dry.  Nodes none.  Neck is supple without thyromegaly .  Chest clear to auscultation.  Cardiac exam regular rate and rhythm.  Extremities without edema.       Assessment & Plan:  Hyperlipidemia-on excellent control with statin medication  Essential hypertension-under excellent control with current regimen of 3 drugs  Hypothyroidism-TSH is normal on current dose of thyroid replacement.  Anxiety and insomnia-treated with Xanax at bedtime.  Plan: Have flu shot in early September.  Otherwise return early October for Medicare wellness and health maintenance exam.

## 2019-04-01 NOTE — Patient Instructions (Addendum)
It was a pleasure to see you today.  I am pleased with your labs which are entirely normal.  Your blood pressure is excellent.  Try to walk some for exercise.  Follow-up early October for annual Medicare wellness and health maintenance exam.  Have flu shot in early September.

## 2019-04-21 ENCOUNTER — Other Ambulatory Visit: Payer: Self-pay | Admitting: Internal Medicine

## 2019-05-27 ENCOUNTER — Other Ambulatory Visit: Payer: Self-pay | Admitting: Internal Medicine

## 2019-06-16 IMAGING — CR DG KNEE 1-2V*R*
2 series · 2 of 2 positions shown · non-contrast
Comparison: None.

CLINICAL DATA: Struck RIGHT knee on object 2 weeks ago. Continued
pain.

EXAM:
RIGHT KNEE - 1-2 VIEW

[w knee ap right]
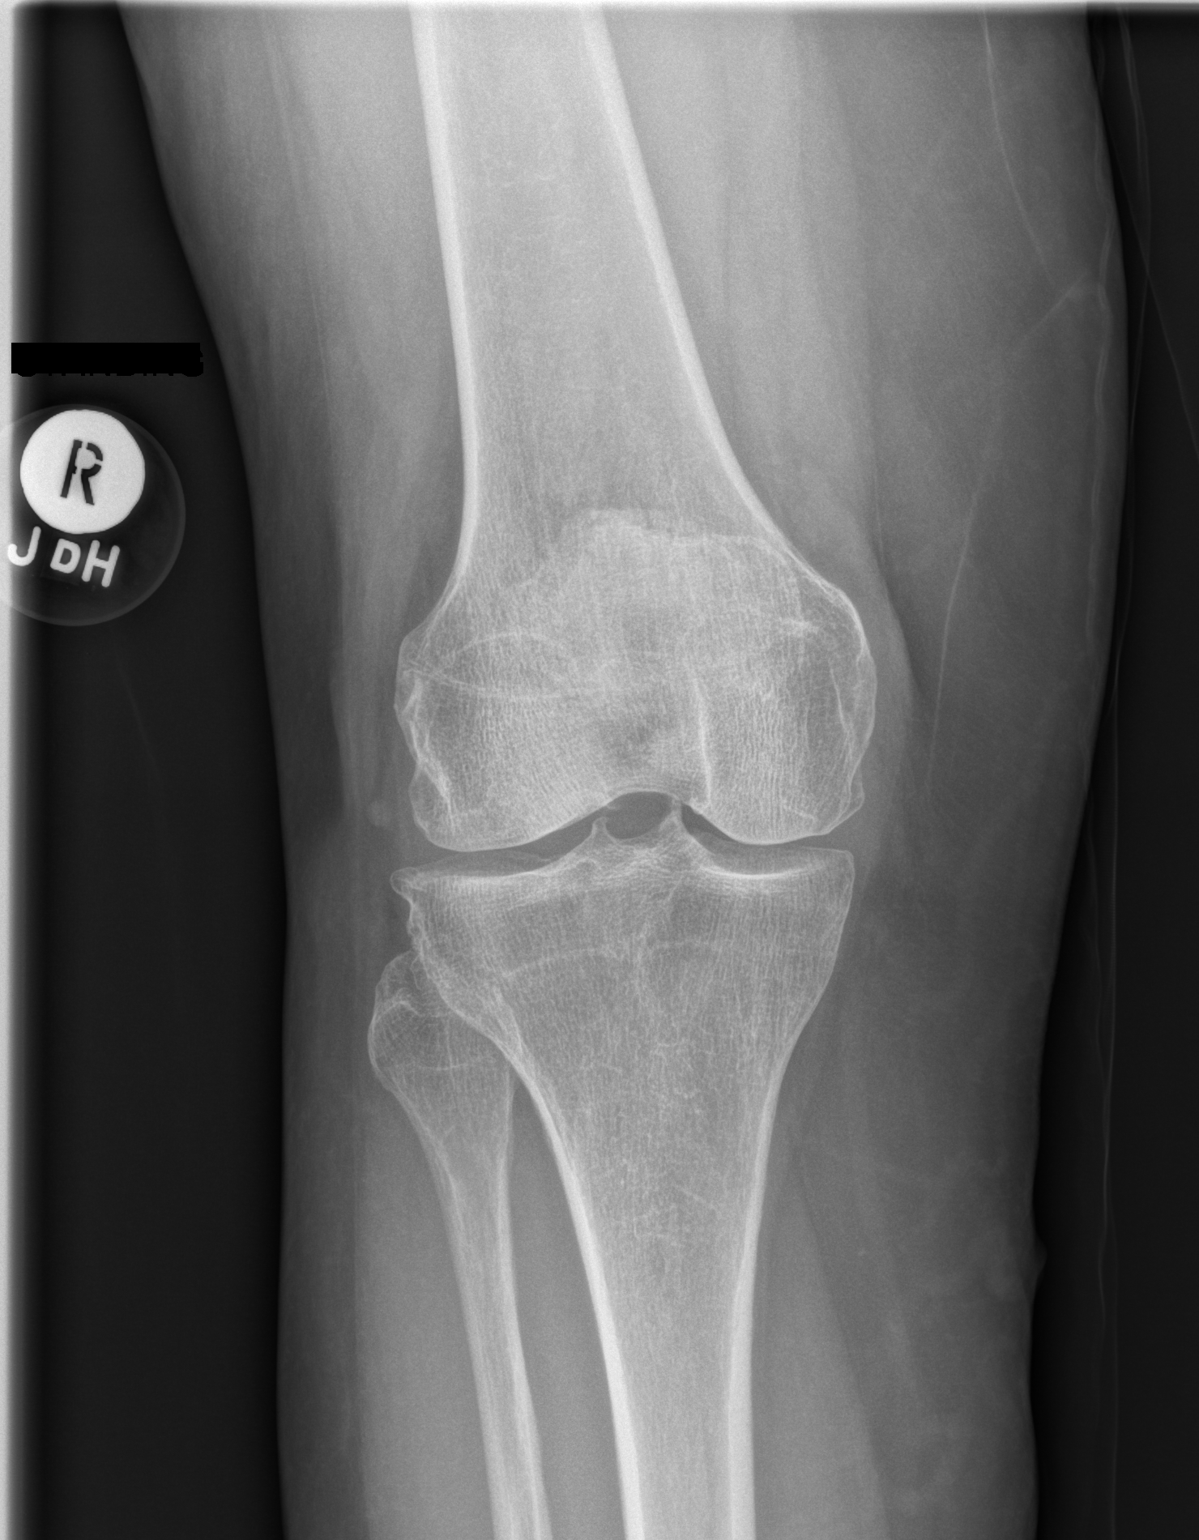

[w knee lat right]
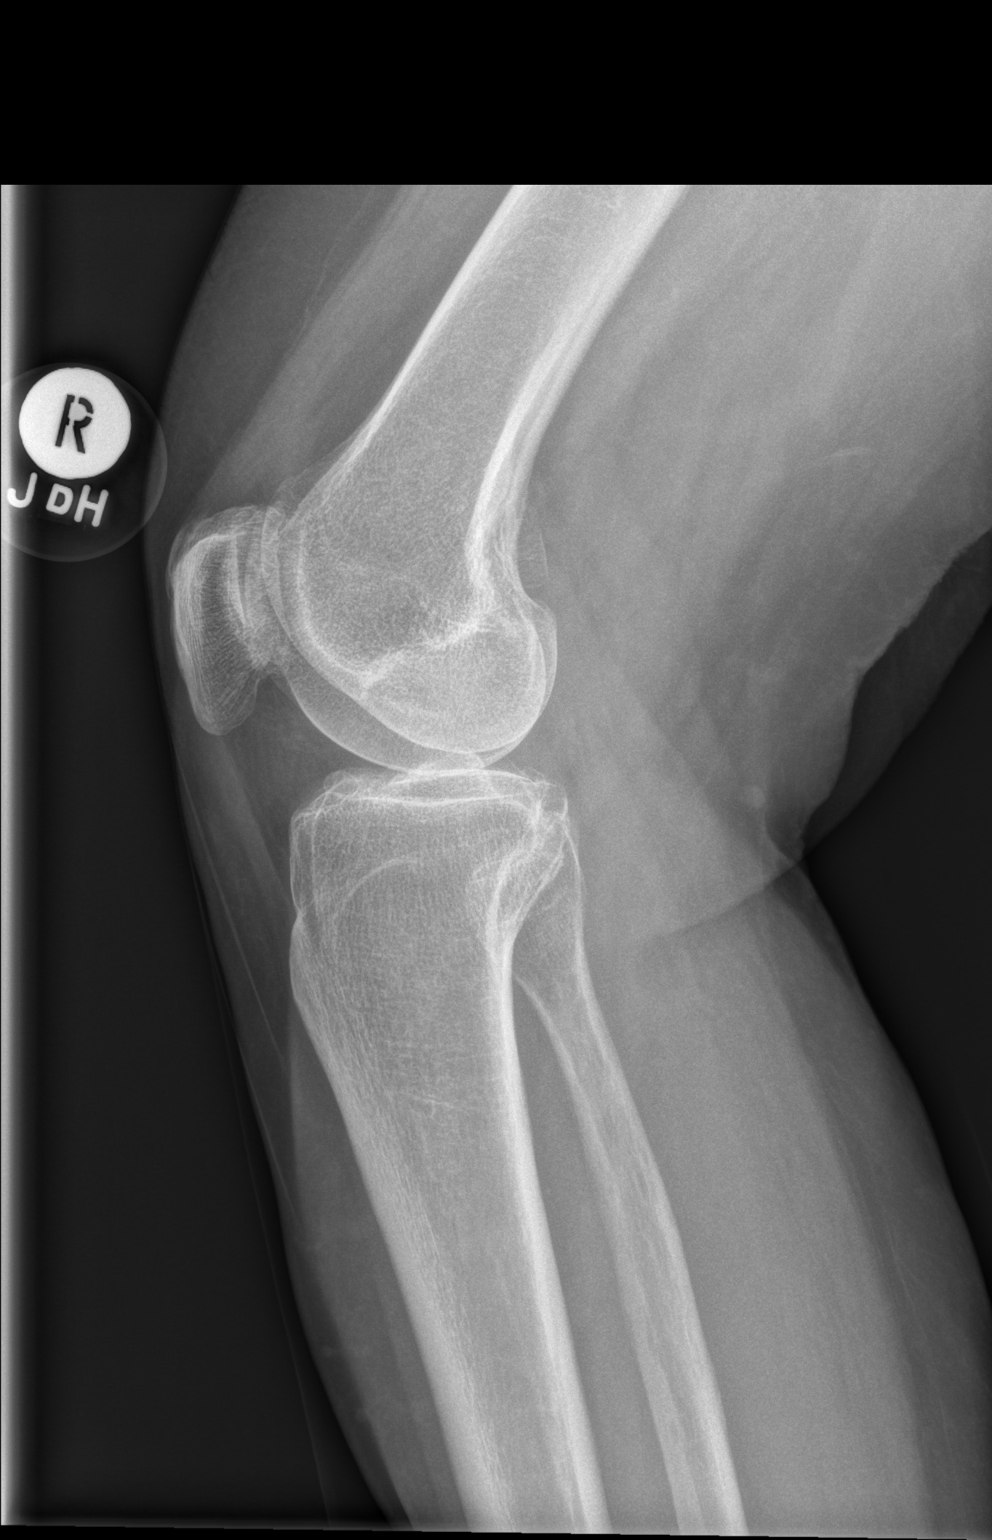

[2 of 2 positions shown; findings below may reference images not displayed]

FINDINGS: Mild tricompartmental degenerative change. No fracture or effusion.
Soft tissues unremarkable.
IMPRESSION: Degenerative joint disease.  No fracture or effusion.

## 2019-06-23 ENCOUNTER — Other Ambulatory Visit: Payer: Self-pay

## 2019-06-23 ENCOUNTER — Ambulatory Visit (INDEPENDENT_AMBULATORY_CARE_PROVIDER_SITE_OTHER): Payer: Medicare HMO | Admitting: Internal Medicine

## 2019-06-23 ENCOUNTER — Encounter: Payer: Self-pay | Admitting: Internal Medicine

## 2019-06-23 DIAGNOSIS — Z23 Encounter for immunization: Secondary | ICD-10-CM | POA: Diagnosis not present

## 2019-06-23 NOTE — Progress Notes (Signed)
Flu vaccine given by CMA 

## 2019-06-23 NOTE — Patient Instructions (Signed)
Patient received a flu vaccine IM L deltoid, AV, CMA  

## 2019-07-06 ENCOUNTER — Other Ambulatory Visit: Payer: Medicare HMO | Admitting: Internal Medicine

## 2019-07-08 ENCOUNTER — Other Ambulatory Visit: Payer: Medicare HMO | Admitting: Internal Medicine

## 2019-07-08 ENCOUNTER — Other Ambulatory Visit: Payer: Self-pay

## 2019-07-08 DIAGNOSIS — Z0000 Encounter for general adult medical examination without abnormal findings: Secondary | ICD-10-CM | POA: Diagnosis not present

## 2019-07-08 DIAGNOSIS — G4700 Insomnia, unspecified: Secondary | ICD-10-CM | POA: Diagnosis not present

## 2019-07-08 DIAGNOSIS — R0989 Other specified symptoms and signs involving the circulatory and respiratory systems: Secondary | ICD-10-CM

## 2019-07-08 DIAGNOSIS — E782 Mixed hyperlipidemia: Secondary | ICD-10-CM

## 2019-07-08 DIAGNOSIS — R7302 Impaired glucose tolerance (oral): Secondary | ICD-10-CM | POA: Diagnosis not present

## 2019-07-08 DIAGNOSIS — E039 Hypothyroidism, unspecified: Secondary | ICD-10-CM | POA: Diagnosis not present

## 2019-07-08 DIAGNOSIS — I10 Essential (primary) hypertension: Secondary | ICD-10-CM

## 2019-07-09 LAB — CBC WITH DIFFERENTIAL/PLATELET
Absolute Monocytes: 487 cells/uL (ref 200–950)
Basophils Absolute: 38 cells/uL (ref 0–200)
Basophils Relative: 0.9 %
Eosinophils Absolute: 38 cells/uL (ref 15–500)
Eosinophils Relative: 0.9 %
HCT: 44.3 % (ref 35.0–45.0)
Hemoglobin: 14.6 g/dL (ref 11.7–15.5)
Lymphs Abs: 1466 cells/uL (ref 850–3900)
MCH: 28.7 pg (ref 27.0–33.0)
MCHC: 33 g/dL (ref 32.0–36.0)
MCV: 87 fL (ref 80.0–100.0)
MPV: 10.8 fL (ref 7.5–12.5)
Monocytes Relative: 11.6 %
Neutro Abs: 2171 cells/uL (ref 1500–7800)
Neutrophils Relative %: 51.7 %
Platelets: 249 10*3/uL (ref 140–400)
RBC: 5.09 10*6/uL (ref 3.80–5.10)
RDW: 13.1 % (ref 11.0–15.0)
Total Lymphocyte: 34.9 %
WBC: 4.2 10*3/uL (ref 3.8–10.8)

## 2019-07-09 LAB — COMPLETE METABOLIC PANEL WITH GFR
AG Ratio: 1.7 (calc) (ref 1.0–2.5)
ALT: 12 U/L (ref 6–29)
AST: 16 U/L (ref 10–35)
Albumin: 4 g/dL (ref 3.6–5.1)
Alkaline phosphatase (APISO): 50 U/L (ref 37–153)
BUN/Creatinine Ratio: 22 (calc) (ref 6–22)
BUN: 22 mg/dL (ref 7–25)
CO2: 26 mmol/L (ref 20–32)
Calcium: 9.9 mg/dL (ref 8.6–10.4)
Chloride: 104 mmol/L (ref 98–110)
Creat: 0.99 mg/dL — ABNORMAL HIGH (ref 0.60–0.88)
GFR, Est African American: 62 mL/min/{1.73_m2} (ref >=60)
GFR, Est Non African American: 54 mL/min/{1.73_m2} — ABNORMAL LOW (ref >=60)
Globulin: 2.4 g/dL (calc) (ref 1.9–3.7)
Glucose, Bld: 93 mg/dL (ref 65–99)
Potassium: 4.1 mmol/L (ref 3.5–5.3)
Sodium: 139 mmol/L (ref 135–146)
Total Bilirubin: 0.5 mg/dL (ref 0.2–1.2)
Total Protein: 6.4 g/dL (ref 6.1–8.1)

## 2019-07-09 LAB — LIPID PANEL
Cholesterol: 173 mg/dL (ref <200)
HDL: 61 mg/dL (ref >=50)
LDL Cholesterol (Calc): 91 mg/dL (calc)
Non-HDL Cholesterol (Calc): 112 mg/dL (calc) (ref <130)
Total CHOL/HDL Ratio: 2.8 (calc) (ref <5.0)
Triglycerides: 112 mg/dL (ref <150)

## 2019-07-09 LAB — HEMOGLOBIN A1C
Hgb A1c MFr Bld: 5.6 % of total Hgb (ref <5.7)
Mean Plasma Glucose: 114 (calc)
eAG (mmol/L): 6.3 (calc)

## 2019-07-09 LAB — TSH: TSH: 0.78 mIU/L (ref 0.40–4.50)

## 2019-07-13 ENCOUNTER — Ambulatory Visit (INDEPENDENT_AMBULATORY_CARE_PROVIDER_SITE_OTHER): Payer: Medicare HMO | Admitting: Internal Medicine

## 2019-07-13 ENCOUNTER — Other Ambulatory Visit: Payer: Self-pay

## 2019-07-13 ENCOUNTER — Encounter: Payer: Self-pay | Admitting: Internal Medicine

## 2019-07-13 VITALS — BP 126/74 | HR 65 | Temp 98.0°F | Ht 63.0 in | Wt 164.0 lb

## 2019-07-13 DIAGNOSIS — M1711 Unilateral primary osteoarthritis, right knee: Secondary | ICD-10-CM

## 2019-07-13 DIAGNOSIS — I10 Essential (primary) hypertension: Secondary | ICD-10-CM | POA: Diagnosis not present

## 2019-07-13 DIAGNOSIS — Z6829 Body mass index (BMI) 29.0-29.9, adult: Secondary | ICD-10-CM | POA: Diagnosis not present

## 2019-07-13 DIAGNOSIS — Z0000 Encounter for general adult medical examination without abnormal findings: Secondary | ICD-10-CM

## 2019-07-13 DIAGNOSIS — G4700 Insomnia, unspecified: Secondary | ICD-10-CM | POA: Diagnosis not present

## 2019-07-13 DIAGNOSIS — M7062 Trochanteric bursitis, left hip: Secondary | ICD-10-CM

## 2019-07-13 DIAGNOSIS — E7800 Pure hypercholesterolemia, unspecified: Secondary | ICD-10-CM

## 2019-07-13 DIAGNOSIS — F411 Generalized anxiety disorder: Secondary | ICD-10-CM

## 2019-07-13 DIAGNOSIS — R69 Illness, unspecified: Secondary | ICD-10-CM | POA: Diagnosis not present

## 2019-07-13 DIAGNOSIS — R0989 Other specified symptoms and signs involving the circulatory and respiratory systems: Secondary | ICD-10-CM | POA: Diagnosis not present

## 2019-07-13 DIAGNOSIS — E782 Mixed hyperlipidemia: Secondary | ICD-10-CM

## 2019-07-13 DIAGNOSIS — E063 Autoimmune thyroiditis: Secondary | ICD-10-CM

## 2019-07-13 DIAGNOSIS — H903 Sensorineural hearing loss, bilateral: Secondary | ICD-10-CM

## 2019-07-13 LAB — POCT URINALYSIS DIPSTICK
Bilirubin, UA: NEGATIVE
Blood, UA: NEGATIVE
Glucose, UA: NEGATIVE
Ketones, UA: NEGATIVE
Leukocytes, UA: NEGATIVE
Nitrite, UA: NEGATIVE
Protein, UA: NEGATIVE
Spec Grav, UA: 1.015 (ref 1.010–1.025)
Urobilinogen, UA: 0.2 E.U./dL
pH, UA: 6 (ref 5.0–8.0)

## 2019-07-13 MED ORDER — MELOXICAM 15 MG PO TABS: 15.0000 mg | ORAL_TABLET | Freq: Every day | ORAL | 3 refills | Status: DC

## 2019-07-13 NOTE — Progress Notes (Signed)
Subjective:    Patient ID: [name], female    DOB: [birth year], 78 y.o.   MRN: [id number]  HPI 78 year old Female for health maintenance exam, Medicare wellness, and evaluation of medical issues.  Patient complaining of left hip and thigh pain.  Her husband purchased a new reclining couch which had a lever that was hard to manipulate.  It may have aggravated her left lower extremity.  She had issues with right knee last year and was treated with meloxicam with success.  Immunizations discussed.  She had recent flu vaccine.  Has not had Shingrix vaccine but I would like to defer that until after the pandemic.  History of hypertension hypothyroidism hyperlipidemia anxiety and insomnia.  Remote history of migraine headaches.  Has not had recent mammogram but was reminded to do so.  Past medical history: Underwent hysterectomy in 2002 with Bilateral oophorectomy.  History of hypertension treated with metoprolol and HCTZ.  She had gout of the left wrist in 2009.  History of ophthalmic migraine 1993.  Had pneumonia in 2003.  She has declined colonoscopy.  Cologuard will be discussed.  She is married and completed high school.  She is retired.  Does not smoke or consume alcohol.  2 adult sons.  History of left carotid bruit.  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  Family history: Father died at age 107 of cancer.  He had a history of MI.  Mother with history of hypertension.  Sister with history of hypertension.    Review of Systems  Respiratory: Negative.   Cardiovascular: Negative.   Gastrointestinal: Negative.   Genitourinary: Negative.   Neurological: Negative.   Psychiatric/Behavioral: Negative.    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  Tender over left lateral hip area     Objective:   Physical Exam Constitutional:       General: She is not in acute distress.    Appearance: Normal appearance. She is not diaphoretic.  HENT:     Head: Normocephalic and atraumatic.     Right Ear: Tympanic membrane and ear canal normal.     Left Ear: Tympanic membrane and ear canal normal.     Nose: Nose normal.     Mouth/Throat:     Mouth: Mucous membranes are moist.     Pharynx: Oropharynx is clear.  Eyes:     General: No scleral icterus.       Right eye: No discharge.        Left eye: No discharge.     Extraocular Movements: Extraocular movements intact.     Conjunctiva/sclera: Conjunctivae normal.     Pupils: Pupils are equal, round, and reactive to light.  Neck:     Musculoskeletal: Neck supple. No neck rigidity.     Comments: Soft left carotid bruit, no thyromegaly Cardiovascular:     Rate and Rhythm: Normal rate and regular rhythm.     Pulses: Normal pulses.     Heart sounds: Normal heart sounds. No murmur.  Pulmonary:     Effort: Pulmonary effort is normal.     Breath sounds: Normal breath sounds. No wheezing or rales.  Abdominal:     General: Bowel sounds are normal.     Palpations: Abdomen is soft. There is no mass.     Tenderness: There is no abdominal tenderness. There is  no rebound.  Musculoskeletal:     Right lower leg: No edema.     Left lower leg: No edema.     Comments: Tender over left trochanter  Lymphadenopathy:     Cervical: No cervical adenopathy.  Skin:    General: Skin is warm and dry.  Neurological:     General: No focal deficit present.     Mental Status: She is alert and oriented to person, place, and time.  Psychiatric:        Mood and Affect: Mood normal.        Behavior: Behavior normal.        Thought Content: Thought content normal.        Judgment: Judgment normal.    Blood pressure 126/74 left arm.  Pulse 65.  Temperature 98 degrees orally.  Pulse oximetry 96%.  Weight 164 pounds, height 5 feet 3 inches, BMI 29.85       Assessment & Plan:  Left trochanteric  bursitis-trial of meloxicam 15 mg daily.  Call if not better in 4 weeks or sooner if worse.  Apply heat or ice to the area.  History of left carotid bruit-asymptomatic with noncritical stenosis previously  BMI 29.95-watch weights and try to get more exercise  Hypothyroidism-stable on current dose of thyroid replacement with normal TSH  Essential hypertension stable with HCTZ metoprolol and losartan  Hyperlipidemia-on statin medication and lipid panel within normal limits as are liver functions.  History of insomnia treated with Xanax-daily with pandemic very well  Anxiety state-worries a lot and has Xanax for insomnia.  History of right knee osteoarthritis treated last year with meloxicam with no complaints this year.  Plan: Defer Shingrix vaccine until after pandemic.  Have mammogram.  Discuss Cologuard.  Subjective:   Patient presents for Medicare Annual/Subsequent preventive examination.  Review Past Medical/Family/Social: See above   Risk Factors  Current exercise habits: Has not walked as much recently but will start walking again Dietary issues discussed: Low-fat low carbohydrate  Cardiac risk factors: Hyperlipidemia, hypertension, family history in father  Depression Screen  (Note: if answer to either of the following is "Yes", a more complete depression screening is indicated)   Over the past two weeks, have you felt down, depressed or hopeless? No  Over the past two weeks, have you felt little interest or pleasure in doing things? No Have you lost interest or pleasure in daily life? No Do you often feel hopeless? No Do you cry easily over simple problems?  Yes anxious and worries a lot  Activities of Daily Living  In your present state of health, do you have any difficulty performing the following activities?:   Driving? No  Managing money? No  Feeding yourself? No  Getting from bed to chair? No  Climbing a flight of stairs? No  Preparing food and eating?: No   Bathing or showering? No  Getting dressed: No  Getting to the toilet? No  Using the toilet:No  Moving around from place to place: No  In the past year have you fallen or had a near fall?:No  Are you sexually active? No  Do you have more than one partner? No   Hearing Difficulties: Yes wears hearing aids bilaterally Do you often ask people to speak up or repeat themselves?  Yes Do you experience ringing or noises in your ears? No  Do you have difficulty understanding soft or whispered voices?  Yes Do you feel that you have a problem with memory? Not bad  Do you often misplace items? Not very often   Home Safety:  Do you have a smoke alarm at your residence? Yes Do you have grab bars in the bathroom?  No Do you have throw rugs in your house?  Yes   Cognitive Testing  Alert? Yes Normal Appearance?Yes  Oriented to person? Yes Place? Yes  Time? Yes  Recall of three objects? Yes  Can perform simple calculations? Yes  Displays appropriate judgment?Yes  Can read the correct time from a watch face?Yes   List the Names of Other Physician/Practitioners you currently use:  See referral list for the physicians patient is currently seeing.     Review of Systems: See above   Objective:     General appearance: Appears younger than stated age and mildly obese  Head: Normocephalic, without obvious abnormality, atraumatic  Eyes: conj clear, EOMi PEERLA  Ears: normal TM's and external ear canals both ears  Nose: Nares normal. Septum midline. Mucosa normal. No drainage or sinus tenderness.  Throat: lips, mucosa, and tongue normal; teeth and gums normal  Neck: no adenopathy, no carotid bruit, no JVD, supple, symmetrical, trachea midline and thyroid not enlarged, symmetric, no tenderness/mass/nodules  No CVA tenderness.  Lungs: clear to auscultation bilaterally  Breasts: normal appearance, no masses or tenderness Heart: regular rate and rhythm, S1, S2 normal, no murmur, click, rub or  gallop  Abdomen: soft, non-tender; bowel sounds normal; no masses, no organomegaly  Musculoskeletal: ROM normal in all joints, no crepitus, no deformity, Normal muscle strengthen. Back  is symmetric, no curvature.  Tender over left greater trochanter Skin: Skin color, texture, turgor normal. No rashes or lesions  Lymph nodes: Cervical, supraclavicular, and axillary nodes normal.  Neurologic: CN 2 -12 Normal, Normal symmetric reflexes. Normal coordination and gait  Psych: Alert & Oriented x 3, Mood appear stable.    Assessment:    Annual wellness medicare exam   Plan:    During the course of the visit the patient was educated and counseled about appropriate screening and preventive services including:   Annual flu vaccine has been given  Defer Shingrix vaccine until after pandemic     Patient Instructions (the written plan) was given to the patient.  Medicare Attestation  I have personally reviewed:  The patient's medical and social history  Their use of alcohol, tobacco or illicit drugs  Their current medications and supplements  The patient's functional ability including ADLs,fall risks, home safety risks, cognitive, and hearing and visual impairment  Diet and physical activities  Evidence for depression or mood disorders  The patient's weight, height, BMI, and visual acuity have been recorded in the chart. I have made referrals, counseling, and provided education to the patient based on review of the above and I have provided the patient with a written personalized care plan for preventive services.

## 2019-07-13 NOTE — Patient Instructions (Addendum)
Try Meloxicam 15 mg daily with food for left trochanteric bursitis.  Apply heat or ice to the area.  Call if not better in 30 days or sooner if worse.  Continue current medications.  Have mammogram.  Return in 1 year or as needed.

## 2019-08-06 ENCOUNTER — Other Ambulatory Visit: Payer: Self-pay

## 2019-08-06 MED ORDER — LOSARTAN POTASSIUM 50 MG PO TABS: 50.0000 mg | ORAL_TABLET | Freq: Every day | ORAL | 3 refills | Status: DC

## 2019-11-25 ENCOUNTER — Other Ambulatory Visit: Payer: Self-pay | Admitting: Internal Medicine

## 2019-12-02 DIAGNOSIS — H5203 Hypermetropia, bilateral: Secondary | ICD-10-CM | POA: Diagnosis not present

## 2019-12-02 DIAGNOSIS — H524 Presbyopia: Secondary | ICD-10-CM | POA: Diagnosis not present

## 2019-12-02 DIAGNOSIS — H52223 Regular astigmatism, bilateral: Secondary | ICD-10-CM | POA: Diagnosis not present

## 2019-12-25 ENCOUNTER — Other Ambulatory Visit: Payer: Self-pay | Admitting: Internal Medicine

## 2020-01-06 ENCOUNTER — Other Ambulatory Visit: Payer: Self-pay

## 2020-01-06 ENCOUNTER — Other Ambulatory Visit: Payer: Medicare HMO | Admitting: Internal Medicine

## 2020-01-06 DIAGNOSIS — E782 Mixed hyperlipidemia: Secondary | ICD-10-CM

## 2020-01-06 DIAGNOSIS — R7302 Impaired glucose tolerance (oral): Secondary | ICD-10-CM

## 2020-01-07 LAB — HEMOGLOBIN A1C
Hgb A1c MFr Bld: 5.7 % of total Hgb — ABNORMAL HIGH (ref <5.7)
Mean Plasma Glucose: 117 (calc)
eAG (mmol/L): 6.5 (calc)

## 2020-01-07 LAB — HEPATIC FUNCTION PANEL
AG Ratio: 1.7 (calc) (ref 1.0–2.5)
ALT: 12 U/L (ref 6–29)
AST: 16 U/L (ref 10–35)
Albumin: 4.1 g/dL (ref 3.6–5.1)
Alkaline phosphatase (APISO): 61 U/L (ref 37–153)
Bilirubin, Direct: 0.1 mg/dL (ref 0.0–0.2)
Globulin: 2.4 g/dL (calc) (ref 1.9–3.7)
Indirect Bilirubin: 0.5 mg/dL (calc) (ref 0.2–1.2)
Total Bilirubin: 0.6 mg/dL (ref 0.2–1.2)
Total Protein: 6.5 g/dL (ref 6.1–8.1)

## 2020-01-07 LAB — LIPID PANEL
Cholesterol: 172 mg/dL (ref <200)
HDL: 59 mg/dL (ref >=50)
LDL Cholesterol (Calc): 94 mg/dL (calc)
Non-HDL Cholesterol (Calc): 113 mg/dL (calc) (ref <130)
Total CHOL/HDL Ratio: 2.9 (calc) (ref <5.0)
Triglycerides: 101 mg/dL (ref <150)

## 2020-01-11 ENCOUNTER — Other Ambulatory Visit: Payer: Self-pay

## 2020-01-11 ENCOUNTER — Ambulatory Visit (INDEPENDENT_AMBULATORY_CARE_PROVIDER_SITE_OTHER): Payer: Medicare HMO | Admitting: Internal Medicine

## 2020-01-11 ENCOUNTER — Encounter: Payer: Self-pay | Admitting: Internal Medicine

## 2020-01-11 VITALS — BP 130/88 | HR 61 | Temp 98.2°F | Ht 63.0 in | Wt 168.0 lb

## 2020-01-11 DIAGNOSIS — E782 Mixed hyperlipidemia: Secondary | ICD-10-CM | POA: Diagnosis not present

## 2020-01-11 DIAGNOSIS — G4700 Insomnia, unspecified: Secondary | ICD-10-CM | POA: Diagnosis not present

## 2020-01-11 DIAGNOSIS — Z8659 Personal history of other mental and behavioral disorders: Secondary | ICD-10-CM

## 2020-01-11 DIAGNOSIS — E039 Hypothyroidism, unspecified: Secondary | ICD-10-CM | POA: Diagnosis not present

## 2020-01-11 DIAGNOSIS — R0989 Other specified symptoms and signs involving the circulatory and respiratory systems: Secondary | ICD-10-CM

## 2020-01-11 DIAGNOSIS — R69 Illness, unspecified: Secondary | ICD-10-CM | POA: Diagnosis not present

## 2020-01-11 DIAGNOSIS — I10 Essential (primary) hypertension: Secondary | ICD-10-CM

## 2020-01-11 MED ORDER — ALPRAZOLAM 0.5 MG PO TABS: 0.5000 mg | ORAL_TABLET | Freq: Every evening | ORAL | 0 refills | Status: DC | PRN

## 2020-01-11 NOTE — Progress Notes (Signed)
   Subjective:    Patient ID: [name], female    DOB: 1941-05-16, 79 y.o.   MRN: [id number]  HPI 79 year old Female in today for 10-month recheck.  History of hypertension, hypothyroidism, hyperlipidemia, anxiety and insomnia.  Hgb A1c stable at 5.7%.  Lipid panel is normal.  Liver functions are normal.  Anxiety stable with as needed Xanax.  TSH was checked in October and was stable on current dose of thyroid replacement medication.  Current dose of thyroid replacement therapy he is doing fine he thinks he has levothyroxine 0.075 mg daily.  Regarding hyperlipidemia is on generic Zocor 20 mg daily.  Blood pressure controlled with HCTZ 25 mg daily, metoprolol 50 mg daily, and losartan 50 mg daily.    Review of Systems no new complaints.  Has Xanax to take as needed anxiety at bedtime.     Objective:   Physical Exam 130/88 pulse 61 temperature 98.2 degrees pulse oximetry 96% weight 168 pounds BMI 29.76  Skin warm and dry.  No cervical adenopathy.  No thyromegaly.  Left carotid bruit.  Removing the ovaries were 3 series of injections 180.  Including chest is clear to auscultation.  Cardiac exam regular rate and rhythm normal S1 and S2 without murmurs or gallops.  No lower extremity edema.  Affect thought and judgment are normal.  Currently not anxious.       Assessment & Plan:  Essential hypertension-stable on current regimen  Hypothyroidism-TSH not checked with this visit continue same dose of thyroid replacement  Hyperlipidemia-lipid panel and liver functions normal on statin medication continue same dose  History of anxiety and has Xanax to take at bedtime if needed  History of left carotid bruit.-Last carotid Doppler 2016 showed 1 to 39% stenosis.  Consider repeat at time of physical exam fall 2021   Plan: Medicare wellness visit and complete physical exam due October 2021.  Continue current medications.

## 2020-01-29 NOTE — Patient Instructions (Signed)
It was a pleasure to see you today.  Continue current medications and follow-up in October 2021 for Medicare wellness, complete physical exam and fasting labs.  Blood pressure is stable on current regimen as is hypothyroidism and hyperlipidemia.

## 2020-02-01 ENCOUNTER — Other Ambulatory Visit: Payer: Self-pay | Admitting: Internal Medicine

## 2020-04-18 DIAGNOSIS — H903 Sensorineural hearing loss, bilateral: Secondary | ICD-10-CM | POA: Diagnosis not present

## 2020-05-23 ENCOUNTER — Telehealth: Payer: Self-pay | Admitting: Internal Medicine

## 2020-05-23 DIAGNOSIS — Z0100 Encounter for examination of eyes and vision without abnormal findings: Secondary | ICD-10-CM | POA: Diagnosis not present

## 2020-05-23 MED ORDER — LEVOTHYROXINE SODIUM 75 MCG PO TABS: 75.0000 ug | ORAL_TABLET | Freq: Every day | ORAL | 0 refills | Status: DC

## 2020-05-23 NOTE — Telephone Encounter (Signed)
Received Fax RX request from  Pharmacy -  [id number] - [place], [place] Phone:  [phone]  Fax:  [phone]       Medication - levothyroxine (SYNTHROID) 75 MCG tablet   Last Refill - 02/26/2020  Last OV - 01/11/20  Last CPE - 07/08/19  Next Appointment - 07/17/20

## 2020-05-23 NOTE — Telephone Encounter (Signed)
Refill x 90 days. Has appt in October

## 2020-07-13 ENCOUNTER — Other Ambulatory Visit: Payer: Medicare HMO | Admitting: Internal Medicine

## 2020-07-13 ENCOUNTER — Other Ambulatory Visit: Payer: Self-pay

## 2020-07-13 DIAGNOSIS — M1711 Unilateral primary osteoarthritis, right knee: Secondary | ICD-10-CM

## 2020-07-13 DIAGNOSIS — Z0000 Encounter for general adult medical examination without abnormal findings: Secondary | ICD-10-CM | POA: Diagnosis not present

## 2020-07-13 DIAGNOSIS — R0989 Other specified symptoms and signs involving the circulatory and respiratory systems: Secondary | ICD-10-CM | POA: Diagnosis not present

## 2020-07-13 DIAGNOSIS — H903 Sensorineural hearing loss, bilateral: Secondary | ICD-10-CM | POA: Diagnosis not present

## 2020-07-13 DIAGNOSIS — E063 Autoimmune thyroiditis: Secondary | ICD-10-CM

## 2020-07-13 DIAGNOSIS — E039 Hypothyroidism, unspecified: Secondary | ICD-10-CM

## 2020-07-13 DIAGNOSIS — I10 Essential (primary) hypertension: Secondary | ICD-10-CM | POA: Diagnosis not present

## 2020-07-13 DIAGNOSIS — G4700 Insomnia, unspecified: Secondary | ICD-10-CM

## 2020-07-13 DIAGNOSIS — E7800 Pure hypercholesterolemia, unspecified: Secondary | ICD-10-CM | POA: Diagnosis not present

## 2020-07-13 DIAGNOSIS — E782 Mixed hyperlipidemia: Secondary | ICD-10-CM

## 2020-07-13 DIAGNOSIS — R7302 Impaired glucose tolerance (oral): Secondary | ICD-10-CM | POA: Diagnosis not present

## 2020-07-14 LAB — CBC WITH DIFFERENTIAL/PLATELET
Absolute Monocytes: 616 cells/uL (ref 200–950)
Basophils Absolute: 49 cells/uL (ref 0–200)
Basophils Relative: 0.8 %
Eosinophils Absolute: 67 cells/uL (ref 15–500)
Eosinophils Relative: 1.1 %
HCT: 46.9 % — ABNORMAL HIGH (ref 35.0–45.0)
Hemoglobin: 15.5 g/dL (ref 11.7–15.5)
Lymphs Abs: 2178 cells/uL (ref 850–3900)
MCH: 28.9 pg (ref 27.0–33.0)
MCHC: 33 g/dL (ref 32.0–36.0)
MCV: 87.3 fL (ref 80.0–100.0)
MPV: 10.6 fL (ref 7.5–12.5)
Monocytes Relative: 10.1 %
Neutro Abs: 3190 cells/uL (ref 1500–7800)
Neutrophils Relative %: 52.3 %
Platelets: 289 10*3/uL (ref 140–400)
RBC: 5.37 10*6/uL — ABNORMAL HIGH (ref 3.80–5.10)
RDW: 13.2 % (ref 11.0–15.0)
Total Lymphocyte: 35.7 %
WBC: 6.1 10*3/uL (ref 3.8–10.8)

## 2020-07-14 LAB — TSH: TSH: 2.24 mIU/L (ref 0.40–4.50)

## 2020-07-14 LAB — COMPLETE METABOLIC PANEL WITH GFR
AG Ratio: 1.9 (calc) (ref 1.0–2.5)
ALT: 11 U/L (ref 6–29)
AST: 15 U/L (ref 10–35)
Albumin: 4.3 g/dL (ref 3.6–5.1)
Alkaline phosphatase (APISO): 59 U/L (ref 37–153)
BUN/Creatinine Ratio: 23 (calc) — ABNORMAL HIGH (ref 6–22)
BUN: 23 mg/dL (ref 7–25)
CO2: 25 mmol/L (ref 20–32)
Calcium: 9.9 mg/dL (ref 8.6–10.4)
Chloride: 99 mmol/L (ref 98–110)
Creat: 1.02 mg/dL — ABNORMAL HIGH (ref 0.60–0.88)
GFR, Est African American: 60 mL/min/{1.73_m2} (ref >=60)
GFR, Est Non African American: 52 mL/min/{1.73_m2} — ABNORMAL LOW (ref >=60)
Globulin: 2.3 g/dL (calc) (ref 1.9–3.7)
Glucose, Bld: 96 mg/dL (ref 65–99)
Potassium: 4 mmol/L (ref 3.5–5.3)
Sodium: 137 mmol/L (ref 135–146)
Total Bilirubin: 0.6 mg/dL (ref 0.2–1.2)
Total Protein: 6.6 g/dL (ref 6.1–8.1)

## 2020-07-14 LAB — LIPID PANEL
Cholesterol: 187 mg/dL (ref <200)
HDL: 63 mg/dL (ref >=50)
LDL Cholesterol (Calc): 101 mg/dL (calc) — ABNORMAL HIGH
Non-HDL Cholesterol (Calc): 124 mg/dL (calc) (ref <130)
Total CHOL/HDL Ratio: 3 (calc) (ref <5.0)
Triglycerides: 134 mg/dL (ref <150)

## 2020-07-14 LAB — HEMOGLOBIN A1C
Hgb A1c MFr Bld: 5.6 % of total Hgb (ref <5.7)
Mean Plasma Glucose: 114 (calc)
eAG (mmol/L): 6.3 (calc)

## 2020-07-17 ENCOUNTER — Other Ambulatory Visit: Payer: Self-pay

## 2020-07-17 ENCOUNTER — Ambulatory Visit (INDEPENDENT_AMBULATORY_CARE_PROVIDER_SITE_OTHER): Payer: Medicare HMO | Admitting: Internal Medicine

## 2020-07-17 ENCOUNTER — Encounter: Payer: Self-pay | Admitting: Internal Medicine

## 2020-07-17 VITALS — BP 120/80 | HR 72 | Ht 62.25 in | Wt 164.0 lb

## 2020-07-17 DIAGNOSIS — E782 Mixed hyperlipidemia: Secondary | ICD-10-CM | POA: Diagnosis not present

## 2020-07-17 DIAGNOSIS — I10 Essential (primary) hypertension: Secondary | ICD-10-CM

## 2020-07-17 DIAGNOSIS — Z23 Encounter for immunization: Secondary | ICD-10-CM

## 2020-07-17 DIAGNOSIS — E039 Hypothyroidism, unspecified: Secondary | ICD-10-CM | POA: Diagnosis not present

## 2020-07-17 DIAGNOSIS — M1711 Unilateral primary osteoarthritis, right knee: Secondary | ICD-10-CM

## 2020-07-17 DIAGNOSIS — R0989 Other specified symptoms and signs involving the circulatory and respiratory systems: Secondary | ICD-10-CM

## 2020-07-17 DIAGNOSIS — Z8659 Personal history of other mental and behavioral disorders: Secondary | ICD-10-CM

## 2020-07-17 DIAGNOSIS — Z0000 Encounter for general adult medical examination without abnormal findings: Secondary | ICD-10-CM | POA: Diagnosis not present

## 2020-07-17 DIAGNOSIS — H903 Sensorineural hearing loss, bilateral: Secondary | ICD-10-CM

## 2020-07-17 LAB — POCT URINALYSIS DIPSTICK
Appearance: NEGATIVE
Bilirubin, UA: NEGATIVE
Blood, UA: NEGATIVE
Glucose, UA: NEGATIVE
Ketones, UA: NEGATIVE
Leukocytes, UA: NEGATIVE
Nitrite, UA: NEGATIVE
Odor: NEGATIVE
Protein, UA: NEGATIVE
Spec Grav, UA: 1.015 (ref 1.010–1.025)
Urobilinogen, UA: 0.2 E.U./dL
pH, UA: 6 (ref 5.0–8.0)

## 2020-07-17 NOTE — Progress Notes (Signed)
Subjective:    Patient ID: [name], female    DOB: [birth year], 79 y.o.   MRN: [id number]  HPI 79 year old Female seen for Medicare wellness and health maintenance exam., and evaluation of medical issues.  History of hypertension, hypothyroidism, hyperlipidemia, anxiety and insomnia.  Remote history of migraine aches.  Patient underwent hysterectomy 2002 with bilateral oophorectomy.  She had gout of the left wrist in 2009.  History of ophthalmic migraine 1993.  Had pneumonia in 2003.  History of left carotid bruit.  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  30 to 35% stenosis in the left internal carotid artery.  Family history: Father died of cancer at age 70.  He had history of MI.  Mother with history of hypertension.  Sister with with history of hypertension.  She has previously declined colonoscopy.  Have discussed Cologuard.  Social history: She is married.  She completed high school.  She is retired.  Does not smoke or consume alcohol.      Review of Systems  Constitutional: Negative.   Respiratory: Negative.   Gastrointestinal: Negative.   Genitourinary: Negative.   Neurological: Negative.   Psychiatric/Behavioral: Negative.        Objective:   Physical Exam Constitutional:      General: She is not in acute distress.    Appearance: Normal appearance.  HENT:     Head: Normocephalic and atraumatic.     Right Ear: Tympanic membrane normal.     Left Ear: Tympanic membrane normal.     Nose: Nose normal.  Eyes:     Extraocular Movements: Extraocular movements intact.     Pupils: Pupils are equal, round, and reactive to light.  Neck:     Vascular: Carotid bruit present.     Comments: Left carotid bruit Cardiovascular:     Rate and Rhythm: Normal rate and regular rhythm.     Heart sounds: Normal heart sounds. No murmur heard.   Pulmonary:     Effort: Pulmonary effort is normal.     Breath sounds:  Normal breath sounds. No wheezing or rales.  Abdominal:     General: Bowel sounds are normal. There is no distension.     Palpations: Abdomen is soft. There is no mass.     Tenderness: There is no right CVA tenderness, guarding or rebound.  Musculoskeletal:     Cervical back: Neck supple. No rigidity.     Right lower leg: No edema.     Left lower leg: No edema.  Lymphadenopathy:     Cervical: No cervical adenopathy.  Skin:    General: Skin is warm and dry.     Findings: No rash.  Neurological:     General: No focal deficit present.     Mental Status: She is alert and oriented to person, place, and time.     Cranial Nerves: No cranial nerve deficit.     Sensory: No sensory deficit.     Gait: Gait normal.  Psychiatric:        Mood and Affect: Mood normal.        Behavior: Behavior normal.        Thought Content: Thought content normal.        Judgment: Judgment normal.           Assessment & Plan:  Left carotid bruit-time to have another duplex study if patient agrees  Colon cancer screening-needs Cologuard if patient  agrees.  Has declined colonoscopy.  Essential hypertension-stable  Hypothyroidism-stable on thyroid replacement therapy.  TSH normal.  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  Recommend carotid duplex study for evaluation of left carotid bruit.  This has not been done in a few years.  Watch diet.  Try to exercise.  Continue current medications.  Stay well-hydrated.  Watch glucose.  Continue current medications.  Return in 6 months.  Subjective:   Patient presents for Medicare Annual/Subsequent preventive examination.  Review Past Medical/Family/Social:  See above   Risk Factors  Current exercise habits: Tries to get some light exercise Dietary issues discussed: Low-fat low carbohydrate discussed  Cardiac risk factors: Hyperlipidemia  Depression Screen  (Note: if answer to either of the following is "Yes", a more complete depression screening is indicated)   Over the past two weeks, have you felt down, depressed or hopeless? No  Over the past two weeks, have you felt little interest or pleasure in doing things? No Have you lost interest or pleasure in daily life? No Do you often feel hopeless? No Do you cry easily over simple problems?  Sometimes  Activities of Daily Living  In your present state of health, do you have any difficulty performing the following activities?:   Driving? No  Managing money? No  Feeding yourself? No  Getting from bed to chair? No  Climbing a flight of stairs? No  Preparing food and eating?: No  Bathing or showering? No  Getting dressed: No  Getting to the toilet? No  Using the toilet:No  Moving around from place to place: No  In the past year have you fallen or had a near fall?:  Yes Are you sexually active? No  Do you have more than one partner? No   Hearing Difficulties:  Do you often ask people to speak up or repeat themselves?  Yes Do you experience ringing or noises in your ears? No  Do you have difficulty understanding soft or whispered voices?  Yes Do you feel that you have a problem with memory?  Yes Do you often misplace items? No    Home Safety:  Do you have a smoke alarm at your residence? Yes Do you have grab bars in the bathroom?  No Do you have throw rugs in your house?  Yes   Cognitive Testing  Alert? Yes Normal Appearance?Yes  Oriented to person? Yes Place? Yes  Time? Yes  Recall of three objects? Yes  Can perform simple calculations? Yes  Displays appropriate judgment?Yes  Can read the correct time from a watch face?Yes   List the Names of Other  Physician/Practitioners you currently use:  See referral list for the physicians patient is currently seeing.  See above   Review of Systems: See above  Objective:     General appearance: Appears younger than stated age Head: Normocephalic, without obvious abnormality, atraumatic  Eyes: conj clear, EOMi PEERLA  Ears: normal TM's and external ear canals both ears  Nose: Nares normal. Septum midline. Mucosa normal. No drainage or sinus tenderness.  Throat: lips, mucosa, and tongue normal; teeth and gums normal  Neck: no adenopathy, no carotid bruit, no JVD, supple, symmetrical, trachea midline and thyroid not enlarged, symmetric, no tenderness/mass/nodules  No CVA tenderness.  Lungs: clear to auscultation bilaterally  Breasts: normal appearance, no masses Heart: regular rate and rhythm, S1, S2 normal, no murmur, click, rub or gallop  Abdomen: soft, non-tender; bowel sounds normal; no masses, no organomegaly  Musculoskeletal: ROM normal in all joints, no crepitus, no deformity, Normal muscle strengthen. Back  is symmetric, no curvature. Skin: Skin color, texture, turgor normal. No rashes or lesions  Lymph nodes: Cervical, supraclavicular, and axillary nodes normal.  Neurologic: CN 2 -12 Normal, Normal symmetric reflexes. Normal coordination and gait  Psych: Alert & Oriented x 3, Mood appear stable.    Assessment:    Annual wellness medicare exam   Plan:    During the course of the visit the patient was educated and counseled about appropriate screening and preventive services including:   Has had COVID-19 vaccines.  Pneumococcal vaccines up-to-date.  Tetanus immunization is up-to-date.  Flu vaccine given.     Patient Instructions (the written plan) was given to the patient.  Medicare Attestation  I have personally reviewed:  The patient's medical and social history  Their use of alcohol, tobacco or illicit drugs  Their current medications and supplements  The patient's  functional ability including ADLs,fall risks, home safety risks, cognitive, and hearing and visual impairment  Diet and physical activities  Evidence for depression or mood disorders  The patient's weight, height, BMI, and visual acuity have been recorded in the chart. I have made referrals, counseling, and provided education to the patient based on review of the above and I have provided the patient with a written personalized care plan for preventive services.       Plan: See above return in 6 months or as needed

## 2020-07-17 NOTE — Patient Instructions (Addendum)
It was a pleasure to see you today. Continue same medications and follow up in 6 months. Have 3rd Covid vaccine soon. Flu vaccine given today.  Would suggest repeat carotid duplex study and Cologuard test.

## 2020-08-01 DIAGNOSIS — D2271 Melanocytic nevi of right lower limb, including hip: Secondary | ICD-10-CM | POA: Diagnosis not present

## 2020-08-01 DIAGNOSIS — L820 Inflamed seborrheic keratosis: Secondary | ICD-10-CM | POA: Diagnosis not present

## 2020-08-01 DIAGNOSIS — L821 Other seborrheic keratosis: Secondary | ICD-10-CM | POA: Diagnosis not present

## 2020-08-03 ENCOUNTER — Other Ambulatory Visit: Payer: Self-pay

## 2020-08-03 DIAGNOSIS — Z1211 Encounter for screening for malignant neoplasm of colon: Secondary | ICD-10-CM

## 2020-08-03 DIAGNOSIS — R0989 Other specified symptoms and signs involving the circulatory and respiratory systems: Secondary | ICD-10-CM

## 2020-08-04 ENCOUNTER — Other Ambulatory Visit: Payer: Self-pay | Admitting: Internal Medicine

## 2020-08-20 ENCOUNTER — Other Ambulatory Visit: Payer: Self-pay | Admitting: Internal Medicine

## 2020-10-04 ENCOUNTER — Telehealth: Payer: Self-pay

## 2020-10-04 NOTE — Telephone Encounter (Signed)
Left message needs to return cologuard specimen.

## 2020-11-19 ENCOUNTER — Other Ambulatory Visit: Payer: Self-pay | Admitting: Internal Medicine

## 2020-11-22 ENCOUNTER — Other Ambulatory Visit: Payer: Self-pay | Admitting: Internal Medicine

## 2020-12-06 DIAGNOSIS — H04123 Dry eye syndrome of bilateral lacrimal glands: Secondary | ICD-10-CM | POA: Diagnosis not present

## 2020-12-06 DIAGNOSIS — H2513 Age-related nuclear cataract, bilateral: Secondary | ICD-10-CM | POA: Diagnosis not present

## 2020-12-22 ENCOUNTER — Other Ambulatory Visit: Payer: Self-pay | Admitting: Internal Medicine

## 2021-01-11 ENCOUNTER — Other Ambulatory Visit: Payer: Self-pay

## 2021-01-11 ENCOUNTER — Other Ambulatory Visit: Payer: Medicare HMO | Admitting: Internal Medicine

## 2021-01-11 DIAGNOSIS — E063 Autoimmune thyroiditis: Secondary | ICD-10-CM | POA: Diagnosis not present

## 2021-01-11 DIAGNOSIS — E782 Mixed hyperlipidemia: Secondary | ICD-10-CM

## 2021-01-11 DIAGNOSIS — E039 Hypothyroidism, unspecified: Secondary | ICD-10-CM | POA: Diagnosis not present

## 2021-01-11 DIAGNOSIS — I10 Essential (primary) hypertension: Secondary | ICD-10-CM | POA: Diagnosis not present

## 2021-01-11 DIAGNOSIS — H903 Sensorineural hearing loss, bilateral: Secondary | ICD-10-CM | POA: Diagnosis not present

## 2021-01-11 DIAGNOSIS — E7800 Pure hypercholesterolemia, unspecified: Secondary | ICD-10-CM

## 2021-01-11 DIAGNOSIS — R69 Illness, unspecified: Secondary | ICD-10-CM | POA: Diagnosis not present

## 2021-01-12 LAB — TSH: TSH: 1.83 mIU/L (ref 0.40–4.50)

## 2021-01-12 LAB — HEPATIC FUNCTION PANEL
AG Ratio: 2 (calc) (ref 1.0–2.5)
ALT: 9 U/L (ref 6–29)
AST: 15 U/L (ref 10–35)
Albumin: 4.5 g/dL (ref 3.6–5.1)
Alkaline phosphatase (APISO): 59 U/L (ref 37–153)
Bilirubin, Direct: 0.1 mg/dL (ref 0.0–0.2)
Globulin: 2.2 g/dL (calc) (ref 1.9–3.7)
Indirect Bilirubin: 0.4 mg/dL (calc) (ref 0.2–1.2)
Total Bilirubin: 0.5 mg/dL (ref 0.2–1.2)
Total Protein: 6.7 g/dL (ref 6.1–8.1)

## 2021-01-12 LAB — HEMOGLOBIN A1C
Hgb A1c MFr Bld: 5.6 % of total Hgb (ref <5.7)
Mean Plasma Glucose: 114 mg/dL
eAG (mmol/L): 6.3 mmol/L

## 2021-01-12 LAB — LIPID PANEL
Cholesterol: 173 mg/dL (ref <200)
HDL: 68 mg/dL (ref >=50)
LDL Cholesterol (Calc): 85 mg/dL (calc)
Non-HDL Cholesterol (Calc): 105 mg/dL (calc) (ref <130)
Total CHOL/HDL Ratio: 2.5 (calc) (ref <5.0)
Triglycerides: 100 mg/dL (ref <150)

## 2021-01-16 ENCOUNTER — Encounter: Payer: Self-pay | Admitting: Internal Medicine

## 2021-01-16 ENCOUNTER — Ambulatory Visit (INDEPENDENT_AMBULATORY_CARE_PROVIDER_SITE_OTHER): Payer: Medicare HMO | Admitting: Internal Medicine

## 2021-01-16 ENCOUNTER — Other Ambulatory Visit: Payer: Self-pay

## 2021-01-16 VITALS — BP 120/80 | HR 76 | Ht 62.25 in | Wt 162.0 lb

## 2021-01-16 DIAGNOSIS — H903 Sensorineural hearing loss, bilateral: Secondary | ICD-10-CM | POA: Diagnosis not present

## 2021-01-16 DIAGNOSIS — R69 Illness, unspecified: Secondary | ICD-10-CM | POA: Diagnosis not present

## 2021-01-16 DIAGNOSIS — F411 Generalized anxiety disorder: Secondary | ICD-10-CM

## 2021-01-16 DIAGNOSIS — E063 Autoimmune thyroiditis: Secondary | ICD-10-CM

## 2021-01-16 DIAGNOSIS — Z6829 Body mass index (BMI) 29.0-29.9, adult: Secondary | ICD-10-CM | POA: Diagnosis not present

## 2021-01-16 DIAGNOSIS — E782 Mixed hyperlipidemia: Secondary | ICD-10-CM

## 2021-01-16 DIAGNOSIS — Z0000 Encounter for general adult medical examination without abnormal findings: Secondary | ICD-10-CM

## 2021-01-16 DIAGNOSIS — G4700 Insomnia, unspecified: Secondary | ICD-10-CM

## 2021-01-16 DIAGNOSIS — I10 Essential (primary) hypertension: Secondary | ICD-10-CM

## 2021-01-16 NOTE — Progress Notes (Signed)
   Subjective:    Patient ID: [name], female    DOB: [birth year], 80 y.o.   MRN: [id number]  HPI 80 year old Female for 6 month recheck.   Has had 3 Covid-19 vaccines. Have suggested booster for her.  She has a history of hypertension, hypothyroidism, hyperlipidemia, anxiety and insomnia.  Remote history of gout of the left wrist in 2009.   BP is stable. Labs reviewed.  History of left carotid bruit.  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  Declined colonoscopy.  Cologuard order was sent but she never returned the specimen.  Order was sent in October 2021.  Hemoglobin A1c 5.6%, lipid panel and liver functions are normal. TSH is normal on current dose of thyroid replacement.  She has had 3 COVID vaccines and will be due for booster.  Okay to get Shingrix this summer.  Had Zostavax in 2011.  Review of Systems anxious about her labs today.  Before reviewed     Objective:   Physical Exam BP 120/80, pulse 76 regular pulse oximetry 96% weight 162 pounds BMI 29.39  Skin: Warm and dry.  No cervical adenopathy.  No thyromegaly.  No carotid bruits identified at this visit.  Chest is clear to auscultation without rales or wheezing.  Cardiac exam: Regular rate and rhythm normal S1 and S2 without murmurs or gallops.  No lower extremity pitting edema.  Affect thought and judgment are normal.  She is anxious.       Assessment & Plan:  Essential hypertension-blood pressure stable on metoprolol , HCTZ, and losartan  Hypothyroidism-TSH within normal limits on levothyroxine 75 mcg daily  History of anxiety treated with Xanax at bedtime  Hyperlipidemia treated with Zocor 20 mg daily.  Lipid panel is normal.  Liver functions are normal.  History of impaired glucose tolerance.  Hemoglobin A1c excellent at 5.6% treated with diet alone.  Health maintenance: Mammogram  ordered  Plan: Return in 6 months for health maintenance exam and Medicare wellness visit.  Continue current medications.  Okay to have Shingrix vaccine and fourth COVID vaccine.  Mammogram ordered.

## 2021-01-16 NOTE — Patient Instructions (Addendum)
It was a pleasure to see you today.  Continue current medications.  Your labs are all normal.  Return in 6 months for annual Medicare visit and health maintenance exam. Okay to have fourth Covid booster.  Have mammogram.  Okay to have Shingrix vaccine at pharmacy.

## 2021-02-03 ENCOUNTER — Other Ambulatory Visit: Payer: Self-pay | Admitting: Internal Medicine

## 2021-03-03 DIAGNOSIS — Z0100 Encounter for examination of eyes and vision without abnormal findings: Secondary | ICD-10-CM | POA: Diagnosis not present

## 2021-03-21 ENCOUNTER — Other Ambulatory Visit: Payer: Self-pay | Admitting: Internal Medicine

## 2021-07-17 ENCOUNTER — Other Ambulatory Visit: Payer: Self-pay

## 2021-07-17 ENCOUNTER — Other Ambulatory Visit: Payer: Medicare HMO | Admitting: Internal Medicine

## 2021-07-17 DIAGNOSIS — E782 Mixed hyperlipidemia: Secondary | ICD-10-CM | POA: Diagnosis not present

## 2021-07-17 DIAGNOSIS — I10 Essential (primary) hypertension: Secondary | ICD-10-CM | POA: Diagnosis not present

## 2021-07-17 DIAGNOSIS — E063 Autoimmune thyroiditis: Secondary | ICD-10-CM | POA: Diagnosis not present

## 2021-07-18 LAB — COMPLETE METABOLIC PANEL WITH GFR
AG Ratio: 2 (calc) (ref 1.0–2.5)
ALT: 10 U/L (ref 6–29)
AST: 14 U/L (ref 10–35)
Albumin: 4.5 g/dL (ref 3.6–5.1)
Alkaline phosphatase (APISO): 57 U/L (ref 37–153)
BUN: 15 mg/dL (ref 7–25)
CO2: 28 mmol/L (ref 20–32)
Calcium: 10.3 mg/dL (ref 8.6–10.4)
Chloride: 100 mmol/L (ref 98–110)
Creat: 0.93 mg/dL (ref 0.60–0.95)
Globulin: 2.3 g/dL (calc) (ref 1.9–3.7)
Glucose, Bld: 105 mg/dL — ABNORMAL HIGH (ref 65–99)
Potassium: 5.2 mmol/L (ref 3.5–5.3)
Sodium: 139 mmol/L (ref 135–146)
Total Bilirubin: 0.7 mg/dL (ref 0.2–1.2)
Total Protein: 6.8 g/dL (ref 6.1–8.1)
eGFR: 61 mL/min/{1.73_m2} (ref >=60)

## 2021-07-18 LAB — CBC WITH DIFFERENTIAL/PLATELET
Absolute Monocytes: 504 cells/uL (ref 200–950)
Basophils Absolute: 42 cells/uL (ref 0–200)
Basophils Relative: 0.8 %
Eosinophils Absolute: 42 cells/uL (ref 15–500)
Eosinophils Relative: 0.8 %
HCT: 44.7 % (ref 35.0–45.0)
Hemoglobin: 15 g/dL (ref 11.7–15.5)
Lymphs Abs: 1844 cells/uL (ref 850–3900)
MCH: 29.3 pg (ref 27.0–33.0)
MCHC: 33.6 g/dL (ref 32.0–36.0)
MCV: 87.3 fL (ref 80.0–100.0)
MPV: 10.6 fL (ref 7.5–12.5)
Monocytes Relative: 9.5 %
Neutro Abs: 2867 cells/uL (ref 1500–7800)
Neutrophils Relative %: 54.1 %
Platelets: 263 10*3/uL (ref 140–400)
RBC: 5.12 10*6/uL — ABNORMAL HIGH (ref 3.80–5.10)
RDW: 13.2 % (ref 11.0–15.0)
Total Lymphocyte: 34.8 %
WBC: 5.3 10*3/uL (ref 3.8–10.8)

## 2021-07-18 LAB — TSH: TSH: 1.97 mIU/L (ref 0.40–4.50)

## 2021-07-18 LAB — LIPID PANEL
Cholesterol: 195 mg/dL (ref <200)
HDL: 71 mg/dL (ref >=50)
LDL Cholesterol (Calc): 103 mg/dL (calc) — ABNORMAL HIGH
Non-HDL Cholesterol (Calc): 124 mg/dL (calc) (ref <130)
Total CHOL/HDL Ratio: 2.7 (calc) (ref <5.0)
Triglycerides: 117 mg/dL (ref <150)

## 2021-07-20 ENCOUNTER — Telehealth: Payer: Self-pay | Admitting: Internal Medicine

## 2021-07-20 ENCOUNTER — Ambulatory Visit (INDEPENDENT_AMBULATORY_CARE_PROVIDER_SITE_OTHER): Payer: Medicare HMO | Admitting: Internal Medicine

## 2021-07-20 ENCOUNTER — Other Ambulatory Visit: Payer: Self-pay

## 2021-07-20 ENCOUNTER — Encounter: Payer: Self-pay | Admitting: Internal Medicine

## 2021-07-20 VITALS — BP 118/70 | HR 63 | Temp 97.2°F | Ht 62.25 in | Wt 158.0 lb

## 2021-07-20 DIAGNOSIS — Z0000 Encounter for general adult medical examination without abnormal findings: Secondary | ICD-10-CM | POA: Diagnosis not present

## 2021-07-20 DIAGNOSIS — M1711 Unilateral primary osteoarthritis, right knee: Secondary | ICD-10-CM

## 2021-07-20 DIAGNOSIS — F411 Generalized anxiety disorder: Secondary | ICD-10-CM

## 2021-07-20 DIAGNOSIS — G4709 Other insomnia: Secondary | ICD-10-CM

## 2021-07-20 DIAGNOSIS — E782 Mixed hyperlipidemia: Secondary | ICD-10-CM | POA: Diagnosis not present

## 2021-07-20 DIAGNOSIS — R82998 Other abnormal findings in urine: Secondary | ICD-10-CM

## 2021-07-20 DIAGNOSIS — G4700 Insomnia, unspecified: Secondary | ICD-10-CM

## 2021-07-20 DIAGNOSIS — H903 Sensorineural hearing loss, bilateral: Secondary | ICD-10-CM | POA: Diagnosis not present

## 2021-07-20 DIAGNOSIS — E039 Hypothyroidism, unspecified: Secondary | ICD-10-CM

## 2021-07-20 DIAGNOSIS — R69 Illness, unspecified: Secondary | ICD-10-CM | POA: Diagnosis not present

## 2021-07-20 DIAGNOSIS — R0989 Other specified symptoms and signs involving the circulatory and respiratory systems: Secondary | ICD-10-CM

## 2021-07-20 DIAGNOSIS — I10 Essential (primary) hypertension: Secondary | ICD-10-CM | POA: Diagnosis not present

## 2021-07-20 DIAGNOSIS — Z23 Encounter for immunization: Secondary | ICD-10-CM

## 2021-07-20 LAB — POCT URINALYSIS DIPSTICK
Bilirubin, UA: NEGATIVE
Blood, UA: NEGATIVE
Glucose, UA: NEGATIVE
Ketones, UA: NEGATIVE
Nitrite, UA: NEGATIVE
Protein, UA: NEGATIVE
Spec Grav, UA: 1.01 (ref 1.010–1.025)
Urobilinogen, UA: 0.2 E.U./dL
pH, UA: 5 (ref 5.0–8.0)

## 2021-07-20 MED ORDER — ALPRAZOLAM 0.5 MG PO TABS: ORAL_TABLET | ORAL | 3 refills | Status: DC

## 2021-07-20 NOTE — Progress Notes (Signed)
Subjective:   Patient presents for Medicare Annual/Subsequent preventive examination.  Risk Factors  Current exercise habits: Not a lot of exercise Dietary issues discussed: Low-fat low carbohydrate  Cardiac risk factors:advanced age (older than 14 for men, 7 for women), dyslipidemia, hypertension, and sedentary lifestyle  Depression Screen  (Note: if answer to either of the following is "Yes", a more complete depression screening is indicated)   Over the past two weeks, have you felt down, depressed or hopeless? Yes Over the past two weeks, have you felt little interest or pleasure in doing things? No Have you lost interest or pleasure in daily life? No Do you often feel hopeless? No Do you cry easily over simple problems? Yes  Activities of Daily Living  In your present state of health, do you have any difficulty performing the following activities?:   Driving? No Managing money? No Feeding yourself? No Getting from bed to chair?No Climbing a flight of stairs?  No Preparing food and eating?:  No Bathing or showering?   No Getting dressed:  No Getting to the toilet?  No Using the toilet:  No Moving around from place to place:  No In the past year have you fallen or had a near fall?  No Are you sexually active?  No Do you have more than one partner?  No  Hearing Difficulties:    Do you often ask people to speak up or repeat themselves?  No Do you experience ringing or noises in your ears?   No Do you have difficulty understanding soft or whispered voices?  No Do you feel that you have a problem with memory?  Names only Do you often misplace items?  No   Home Safety:  Do you have a smoke alarm at your residence? Yes Do you have grab bars in the bathroom?  Yes Do you have throw rugs in your house?   Yes   Cognitive Testing  Alert? Yes Normal Appearance?Yes  Oriented to person? Yes Place? Yes  Time? Yes  Recall of three objects? Yes  Can perform simple  calculations? Yes  Displays appropriate judgment?Yes  Can read the correct time from a watch face?Yes   List the Names of Other Physician/Practitioners you currently use:  See referral list for the physicians patient is currently seeing.  Dr. Renda Rolls is Dermatologist and Dr. Eulas Post is ophthalmologist  Patient Instructions (the written plan) was given to the patient.  Medicare Attestation  I have personally reviewed:  The patient's medical and social history  Their use of alcohol, tobacco or illicit drugs  Their current medications and supplements  The patient's functional ability including ADLs,fall risks, home safety risks, cognitive, and hearing and visual impairment  Diet and physical activities  Evidence for depression or mood disorders  The patient's weight, height, BMI, and visual acuity have been recorded in the chart. I have made referrals, counseling, and provided education to the patient based on review of the above and I have provided the patient with a written personalized care plan for preventive services.    Patient presents for health maintenance exam and evaluation of medical issues.  BP 188/70, pulse 63, t 97.2 degrees pulse ox 97% weight 158 pounds. BMI 28.67 down 4 pounds from April  Has irritable bowel symptoms.  Xanax should help this.  She can take 1 tab in the morning and 2 tabs at night.  History of hypothyroidism treated with levothyroxine 75 mcg daily.  History of hyperlipidemia treated with Zocor 20 mg  daily.  History of hypertension treated with losartan, HCTZ and Lopressor with excellent control.  History of anxiety.  History of left carotid bruit.  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  Has declined colonoscopy in the past.  She underwent hysterectomy in 2002 with bilateral oophorectomy.  History of ophthalmic migraine 1993.  History of pneumonia  2003.  Social history: She is married.  Completed high school.  Is retired.  Does not smoke or consume alcohol.  Family history: Father died of cancer at age 71.  He had history of MI.  Mother with history of hypertension.  Sister with history of hypertension.  Vital signs reviewed.  Blood pressure is stable.  Skin: Warm and dry.  Nodes none.  TMs clear.    Neck is supple.  Soft left carotid bruit no thyromegaly.  Chest is clear.  Cardiac exam: Regular rate and rhythm.  Abdomen soft nondistended without hepatosplenomegaly masses or tenderness.  No lower extremity pitting edema.  Neurological exam is intact without focal deficits.  Impression:  Hypertension-stable with current regimen of HCTZ, metoprolol and losartan  Hyperlipidemia stable on lipid-lowering medication  History of left carotid bruit-asymptomatic and has been evaluated several years ago  History of insomnia-treated with Xanax  Anxiety state treated with Xanax  History of osteoarthritis right knee  Hypothyroidism treated with thyroid replacement medication-levothyroxine 75 mcg daily  Sensorineural hearing loss-wears bilateral hearing aids  Flu vaccine need  Plan: Fasting glucose is 105-patient is to watch diet.  Fasting lipid panel stable with LDL at 103, total cholesterol 195, HDL 71 and triglycerides 117.  Continue statin medication for lipid control.  Hypothyroidism-with stable TSH of 1.97 on thyroid replacement medication.  Continue Xanax for anxiety and insomnia.  Return in 1 year or as needed.  Continue to wear hearing aids.  Flu vaccine given.

## 2021-07-20 NOTE — Patient Instructions (Addendum)
Flu vaccine given. May take Xanax 0.5 mg  1 or 2 tabs at bedtime for sleep and one tab in am if needed for anxiety or diarrhea.  Urine culture is negative.  Return in 1 year or as needed.  Continue medications as prescribed.

## 2021-07-20 NOTE — Telephone Encounter (Signed)
[name] [phone]  [name] called back to say she forgot to mention that she has had sinus drainage for quite sometime, she is all the time blowing her nose, it is always clear. Is there something over the counter or that you can prescribe that she can take that will not run her blood pressure up to help with this?

## 2021-07-20 NOTE — Telephone Encounter (Signed)
Results have been relayed to the patient. The patient verbalized understanding. No questions at this time.   

## 2021-07-21 LAB — URINE CULTURE
MICRO NUMBER:: 12504512
SPECIMEN QUALITY:: ADEQUATE

## 2021-08-03 ENCOUNTER — Other Ambulatory Visit: Payer: Self-pay | Admitting: Internal Medicine

## 2021-08-31 NOTE — Progress Notes (Signed)
IElby Showers, MD, have reviewed all documentation for this visit. The documentation on 08/31/21 for the exam, diagnosis, procedures, and orders are all accurate and complete.

## 2021-09-05 ENCOUNTER — Telehealth: Payer: Self-pay

## 2021-09-05 NOTE — Telephone Encounter (Signed)
Patients husband called stating that he needed the patient seen as soon as possible. He states that she is bleeding from her rectum and has been since at least last night maybe longer. He has been advised to take her to the hospital. He has agreed.

## 2021-09-06 ENCOUNTER — Emergency Department (HOSPITAL_COMMUNITY)
Admission: EM | Admit: 2021-09-06 | Discharge: 2021-09-06 | Disposition: A | Discharge status: Home / Self Care | Payer: Medicare HMO | Attending: Emergency Medicine | Admitting: Emergency Medicine

## 2021-09-06 ENCOUNTER — Other Ambulatory Visit: Payer: Self-pay

## 2021-09-06 ENCOUNTER — Encounter (HOSPITAL_COMMUNITY): Payer: Self-pay

## 2021-09-06 DIAGNOSIS — K625 Hemorrhage of anus and rectum: Secondary | ICD-10-CM | POA: Diagnosis not present

## 2021-09-06 DIAGNOSIS — Z79899 Other long term (current) drug therapy: Secondary | ICD-10-CM | POA: Insufficient documentation

## 2021-09-06 DIAGNOSIS — I10 Essential (primary) hypertension: Secondary | ICD-10-CM | POA: Diagnosis not present

## 2021-09-06 DIAGNOSIS — E039 Hypothyroidism, unspecified: Secondary | ICD-10-CM | POA: Diagnosis not present

## 2021-09-06 LAB — CBC WITH DIFFERENTIAL/PLATELET
Abs Immature Granulocytes: 0.02 10*3/uL (ref 0.00–0.07)
Basophils Absolute: 0.1 10*3/uL (ref 0.0–0.1)
Basophils Relative: 1 %
Eosinophils Absolute: 0.1 10*3/uL (ref 0.0–0.5)
Eosinophils Relative: 1 %
HCT: 46 % (ref 36.0–46.0)
Hemoglobin: 15.1 g/dL — ABNORMAL HIGH (ref 12.0–15.0)
Immature Granulocytes: 0 %
Lymphocytes Relative: 30 %
Lymphs Abs: 1.9 10*3/uL (ref 0.7–4.0)
MCH: 29.1 pg (ref 26.0–34.0)
MCHC: 32.8 g/dL (ref 30.0–36.0)
MCV: 88.6 fL (ref 80.0–100.0)
Monocytes Absolute: 0.5 10*3/uL (ref 0.1–1.0)
Monocytes Relative: 8 %
Neutro Abs: 3.8 10*3/uL (ref 1.7–7.7)
Neutrophils Relative %: 60 %
Platelets: 269 10*3/uL (ref 150–400)
RBC: 5.19 MIL/uL — ABNORMAL HIGH (ref 3.87–5.11)
RDW: 14.2 % (ref 11.5–15.5)
WBC: 6.3 10*3/uL (ref 4.0–10.5)
nRBC: 0 % (ref 0.0–0.2)

## 2021-09-06 LAB — COMPREHENSIVE METABOLIC PANEL
ALT: 13 U/L (ref 0–44)
AST: 18 U/L (ref 15–41)
Albumin: 4.3 g/dL (ref 3.5–5.0)
Alkaline Phosphatase: 57 U/L (ref 38–126)
Anion gap: 9 (ref 5–15)
BUN: 20 mg/dL (ref 8–23)
CO2: 25 mmol/L (ref 22–32)
Calcium: 9.2 mg/dL (ref 8.9–10.3)
Chloride: 102 mmol/L (ref 98–111)
Creatinine, Ser: 0.91 mg/dL (ref 0.44–1.00)
GFR, Estimated: >60 mL/min (ref >60)
Glucose, Bld: 108 mg/dL — ABNORMAL HIGH (ref 70–99)
Potassium: 3.6 mmol/L (ref 3.5–5.1)
Sodium: 136 mmol/L (ref 135–145)
Total Bilirubin: 0.7 mg/dL (ref 0.3–1.2)
Total Protein: 7.1 g/dL (ref 6.5–8.1)

## 2021-09-06 LAB — URINALYSIS, ROUTINE W REFLEX MICROSCOPIC
Bilirubin Urine: NEGATIVE
Glucose, UA: NEGATIVE mg/dL
Hgb urine dipstick: NEGATIVE
Ketones, ur: NEGATIVE mg/dL
Nitrite: NEGATIVE
Protein, ur: NEGATIVE mg/dL
Specific Gravity, Urine: 1.013 (ref 1.005–1.030)
pH: 5 (ref 5.0–8.0)

## 2021-09-06 LAB — LIPASE, BLOOD: Lipase: 26 U/L (ref 11–51)

## 2021-09-06 LAB — TYPE AND SCREEN
ABO/RH(D): A POS
Antibody Screen: NEGATIVE

## 2021-09-06 LAB — PROTIME-INR
INR: 1 (ref 0.8–1.2)
Prothrombin Time: 13.3 seconds (ref 11.4–15.2)

## 2021-09-06 LAB — POC OCCULT BLOOD, ED: Fecal Occult Bld: NEGATIVE

## 2021-09-06 NOTE — Discharge Instructions (Addendum)
You were seen in the emergency department for evaluation of rectal bleeding.  Your blood counts were stable and you had no evidence of bleeding currently.  This may be hemorrhoid bleeding but he will need to follow-up with gastroenterology to get a colonoscopy.  Please contact your primary care doctor if you need a referral.  Return to the emergency department if any worsening or concerning symptoms

## 2021-09-06 NOTE — Telephone Encounter (Signed)
Called and let [name] know that [name] needs to go to Emergency room, he verbalized understanding.

## 2021-09-06 NOTE — Telephone Encounter (Signed)
Don called to say [name] would not go to ED yesterday, she is having blood 2 out of 4 bowel movements. She wants to come and see you.

## 2021-09-06 NOTE — ED Triage Notes (Signed)
Pt states for the past couple of days she has noticed blood in her stool. Pt unsure if it is likely due to her hemorrhoids. Denies any other symptoms

## 2021-09-06 NOTE — ED Provider Notes (Signed)
Emergency Medicine Provider Triage Evaluation Note  [name] , a 80 y.o. female  was evaluated in triage.  Pt complains of rectal bleeding x1 week.  Started lightly with bright red blood, currently having significant quantity of bright red blood in 2 out of 4 bowel movements.  She is also having intermittent abdominal pain.  No lightheadedness or dizziness, no syncopal episodes.  Not on any blood thinners, no history of gastric ulcers, does regularly not take anti-inflammatory medicine or drink alcohol.  Patient has never underwent a colonoscopy.  Does have history of hemorrhoids.  Review of Systems  Positive: AP, BBBPR Negative: Syncope  Physical Exam  BP (!) 148/83   Pulse 64   Temp 97.9 F (36.6 C) (Oral)   Resp 17   SpO2 99%  Gen:   Awake, no distress   Resp:  Normal effort  MSK:   Moves extremities without difficulty  Other:  Abdomen is soft, no specific tenderness.  No tachycardia, resting comfortably  Medical Decision Making  Medically screening exam initiated at 12:38 PM.  Appropriate orders placed.  [name] was informed that the remainder of the evaluation will be completed by another provider, this initial triage assessment does not replace that evaluation, and the importance of remaining in the ED until their evaluation is complete.  Hemodynamically stable, no peritoneal signs on abdominal exam.  Abdominal labs, coags   Sherrill Raring, Hershal Coria 09/06/21 1241    Isla Pence, MD 09/06/21 1325

## 2021-09-06 NOTE — ED Provider Notes (Signed)
[facility] DEPT Provider Note   CSN: [id number] Arrival date & time: 09/06/21  1212     History Chief Complaint  Patient presents with   GI Bleeding    [name] is a 80 y.o. female.  She is here with a complaint of bright red blood per rectum.  She said this has been going on for months although more frequently over the last few days.  Occurs with bowel movements.  Stools are formed and nonbloody.  Associated with some bright red blood.  Sometimes has some rectal pain and lower abdominal pain.  Had a normal bowel movement today without any bleeding.  No fevers or chills.  No weight loss.  Has not had a colonoscopy before.  The history is provided by the patient.  Rectal Bleeding Quality:  Bright red Amount:  Scant Duration:  1 week Timing:  Intermittent Chronicity:  Recurrent Context: defecation, hemorrhoids and rectal pain   Similar prior episodes: yes   Relieved by:  None tried Worsened by:  Defecation Ineffective treatments:  None tried Associated symptoms: abdominal pain   Associated symptoms: no fever, no hematemesis, no light-headedness, no loss of consciousness and no vomiting   Abdominal pain:    Location:  Suprapubic   Quality: cramping     Severity:  Mild   Timing:  Sporadic   Progression:  Resolved Risk factors: no anticoagulant use       Past Medical History:  Diagnosis Date   Anxiety    Hearing loss of both ears    History of migraines    Hyperlipidemia    Hypertension     Patient Active Problem List   Diagnosis Date Noted   Menopausal syndrome (hot flashes) 06/30/2012   Hypertension 12/24/2011   Anxiety 12/24/2011   Hypothyroidism 06/17/2011   Hyperlipidemia 06/17/2011    Past Surgical History:  Procedure Laterality Date   ABDOMINAL HYSTERECTOMY     carotid bruit     left   hearing loss     bilateral   migraine        OB History   No obstetric history on file.     Family History  Problem  Relation Age of Onset   Cancer Father     Social History   Tobacco Use   Smoking status: Never   Smokeless tobacco: Never  Substance Use Topics   Alcohol use: Never   Drug use: No    Home Medications Prior to Admission medications   Medication Sig Start Date End Date Taking? Authorizing Provider  ALPRAZolam Duanne Moron) 0.5 MG tablet One tab q am and 2 tabs hs 07/20/21   Elby Showers, MD  cholecalciferol (VITAMIN D) 1000 UNITS tablet Take 2,000 Units by mouth daily.    [provider]  hydrochlorothiazide (HYDRODIURIL) 25 MG tablet TAKE 1 TABLET BY MOUTH EVERY DAY 11/22/20   Elby Showers, MD  levothyroxine (SYNTHROID) 75 MCG tablet TAKE 1 TABLET BY MOUTH EVERY DAY 03/21/21   Elby Showers, MD  losartan (COZAAR) 50 MG tablet TAKE 1 TABLET (50 MG TOTAL) BY MOUTH DAILY. DISCONTINUE LOSARTAN 100MG  08/03/21   Elby Showers, MD  metoprolol tartrate (LOPRESSOR) 50 MG tablet TAKE 1 TABLET BY MOUTH EVERY DAY 12/22/20   Elby Showers, MD  simvastatin (ZOCOR) 20 MG tablet TAKE 1 TABLET BY MOUTH EVERY DAY 02/03/21   Elby Showers, MD    Allergies    Patient has no known allergies.  Review of  Systems   Review of Systems  Constitutional:  Negative for fever.  HENT:  Negative for sore throat.   Eyes:  Negative for visual disturbance.  Respiratory:  Negative for shortness of breath.   Cardiovascular:  Negative for chest pain.  Gastrointestinal:  Positive for abdominal pain, blood in stool and hematochezia. Negative for hematemesis and vomiting.  Genitourinary:  Negative for dysuria.  Musculoskeletal:  Negative for neck pain.  Skin:  Negative for rash.  Neurological:  Negative for loss of consciousness and light-headedness.   Physical Exam Updated Vital Signs BP (!) 155/94   Pulse 81   Temp 97.9 F (36.6 C) (Oral)   Resp 18   SpO2 99%   Physical Exam Vitals and nursing note reviewed.  Constitutional:      General: She is not in acute distress.    Appearance: Normal  appearance. She is well-developed.  HENT:     Head: Normocephalic and atraumatic.  Eyes:     Conjunctiva/sclera: Conjunctivae normal.  Cardiovascular:     Rate and Rhythm: Normal rate and regular rhythm.     Heart sounds: No murmur heard. Pulmonary:     Effort: Pulmonary effort is normal. No respiratory distress.     Breath sounds: Normal breath sounds.  Abdominal:     Palpations: Abdomen is soft.     Tenderness: There is no abdominal tenderness. There is no guarding or rebound.  Genitourinary:    Rectum: Normal.     Comments: Rectal exam done with tech as chaperone.  She has some external skin tags.  Normal tone no masses.  No gross blood.  Sample sent to lab for guaiac. Musculoskeletal:        General: No swelling.     Cervical back: Neck supple.  Skin:    General: Skin is warm and dry.     Capillary Refill: Capillary refill takes less than 2 seconds.  Neurological:     General: No focal deficit present.     Mental Status: She is alert.  Psychiatric:        Mood and Affect: Mood normal.    ED Results / Procedures / Treatments   Labs (all labs ordered are listed, but only abnormal results are displayed) Labs Reviewed  CBC WITH DIFFERENTIAL/PLATELET - Abnormal; Notable for the following components:      Result Value   RBC 5.19 (*)    Hemoglobin 15.1 (*)    All other components within normal limits  COMPREHENSIVE METABOLIC PANEL - Abnormal; Notable for the following components:   Glucose, Bld 108 (*)    All other components within normal limits  URINALYSIS, ROUTINE W REFLEX MICROSCOPIC - Abnormal; Notable for the following components:   Leukocytes,Ua TRACE (*)    Bacteria, UA RARE (*)    All other components within normal limits  URINE CULTURE  LIPASE, BLOOD  PROTIME-INR  POC OCCULT BLOOD, ED  TYPE AND SCREEN    EKG None  Radiology No results found.  Procedures Procedures   Medications Ordered in ED Medications - No data to display  ED Course  I have  reviewed the triage vital signs and the nursing notes.  Pertinent labs & imaging results that were available during my care of the patient were reviewed by me and considered in my medical decision making (see chart for details).  Clinical Course as of 09/07/21 1051  Thu Sep 06, 2021  1735 Patient's hemoglobin is stable from priors and normal BUN and creatinine.  Doubt significant  blood loss.  Rectal exam done with no masses.  Unfortunately patient has never had a colonoscopy and so counseled that this is likely hemorrhoid bleeding but will need further evaluation by GI. [MB]    Clinical Course User Index [MB] Hayden Rasmussen, MD   MDM Rules/Calculators/A&P                          This patient complains of bright red blood per rectum; this involves an extensive number of treatment Options and is a complaint that carries with it a high risk of complications and Morbidity. The differential includes hemorrhoids, AVM, diverticulitis, colitis, cancer  I ordered, reviewed and interpreted labs, which included CBC with normal white count, hemoglobin stable from priors, chemistries and LFTs normal, urinalysis without signs of infection, fecal occult negative   Previous records obtained and reviewed in epic no recent admissions  After the interventions stated above, I reevaluated the patient and found patient be hemodynamically stable.  Reviewed results of work-up with her.  We will place a referral into GI for her.  Recommended close follow-up with PCP and increase her fiber intake.  Return instructions discussed   Final Clinical Impression(s) / ED Diagnoses Final diagnoses:  Rectal bleeding    Rx / DC Orders ED Discharge Orders          Ordered    Ambulatory referral to Gastroenterology        09/06/21 1805             Hayden Rasmussen, MD 09/07/21 1052

## 2021-09-07 LAB — URINE CULTURE: Culture: NO GROWTH

## 2021-09-11 ENCOUNTER — Other Ambulatory Visit: Payer: Self-pay

## 2021-09-11 ENCOUNTER — Ambulatory Visit (INDEPENDENT_AMBULATORY_CARE_PROVIDER_SITE_OTHER): Payer: Medicare HMO | Admitting: Internal Medicine

## 2021-09-11 VITALS — BP 130/66 | HR 64 | Temp 98.2°F | Resp 18 | Ht 62.5 in | Wt 157.0 lb

## 2021-09-11 DIAGNOSIS — K625 Hemorrhage of anus and rectum: Secondary | ICD-10-CM | POA: Diagnosis not present

## 2021-09-11 DIAGNOSIS — K580 Irritable bowel syndrome with diarrhea: Secondary | ICD-10-CM

## 2021-09-20 ENCOUNTER — Ambulatory Visit: Payer: Medicare HMO | Admitting: Gastroenterology

## 2021-10-16 ENCOUNTER — Ambulatory Visit: Payer: Medicare HMO | Admitting: Gastroenterology

## 2021-10-24 NOTE — Progress Notes (Signed)
° °  Subjective:    Patient ID: [name], female    DOB: Dec 26, 1940, 81 y.o.   MRN: [id number]  HPI 81 year old Female seen for follow-up on rectal bleeding.  Was seen December 1 in the emergency department.  Complained of bright red blood per rectum that has been going on for months although more frequently over the last few days.  Was having issues of bright red blood with bowel movements.  Stools were noted to be formed but not bloody.  Has not had colonoscopy before.  Does not want to have a colonoscopy.  Cologuard was ordered in October 2021 but was not returned.  Occult blood testing in the emergency department was negative.  She has issues with irritable bowel syndrome and Xanax seems to help with that.  She has hypothyroidism and insomnia.  Anxiety and insomnia treated with Xanax.  History of hyperlipidemia treated with lipid-lowering medication and history of hypertension treated with HCTZ, metoprolol and losartan.  Hemoglobin was 15.1 g in the emergency department.  Review of Systems see above-no recent rectal bleeding     Objective:   Physical Exam  No stool was present to guaiac today.  Anoscopy is unremarkable.  There is no rectal bleeding whatsoever.  There is no stool in the rectal vault.      Assessment & Plan:   Recent episode of rectal bleeding  Plan: Probably the most reasonable evaluation at her age would be Cologuard testing.  No tumors noted on anoscopy.  Suspect this was lower GI bleeding perhaps from irritable bowel and/or constipation.  Spoke with her about completing Cologuard but she really does not seem very interested in this.  Follow-up here in April for 46-month recheck or sooner if necessary

## 2021-10-28 ENCOUNTER — Encounter: Payer: Self-pay | Admitting: Internal Medicine

## 2021-10-28 NOTE — Patient Instructions (Addendum)
No evidence of rectal bleeding on anoscopy.  Cologuard offered for screening for colon cancer but patient not really interested.  Follow-up here in April for 46-month recheck or sooner if necessary.

## 2021-10-30 ENCOUNTER — Encounter: Payer: Self-pay | Admitting: Physician Assistant

## 2021-10-30 ENCOUNTER — Ambulatory Visit: Payer: Medicare HMO | Admitting: Physician Assistant

## 2021-10-30 VITALS — BP 138/80 | HR 64 | Ht 62.0 in | Wt 156.0 lb

## 2021-10-30 DIAGNOSIS — K625 Hemorrhage of anus and rectum: Secondary | ICD-10-CM | POA: Diagnosis not present

## 2021-10-30 NOTE — Progress Notes (Signed)
Reviewed and agree with documentation and assessment and plan. K. Veena Raiyah Speakman , MD   

## 2021-10-30 NOTE — Patient Instructions (Signed)
If you are age 81 or older, your body mass index should be between 23-30. Your Body mass index is 28.53 kg/m. If this is out of the aforementioned range listed, please consider follow up with your Primary Care Provider. ________________________________________________________  The [facility] providers would like to encourage you to use [facility] to communicate with providers for non-urgent requests or questions.  Due to long hold times on the telephone, sending your provider a message by [facility] may be a faster and more efficient way to get a response.  Please allow 48 business hours for a response.  Please remember that this is for non-urgent requests.  _______________________________________________________  Dennis Bast have been scheduled for a colonoscopy. Please follow written instructions given to you at your visit today.  Please pick up your prep supplies at the pharmacy within the next 1-3 days. If you use inhalers (even only as needed), please bring them with you on the day of your procedure.  Follow up pending the results of your Colonoscopy or as needed.  Thank you for entrusting me with your care and choosing [facility].  Amy Esterwood, PA-C.

## 2021-10-30 NOTE — Progress Notes (Signed)
Subjective:    Patient ID: [name], female    DOB: 03/11/1941, 81 y.o.   MRN: [id number]  HPI [name] is a pleasant 81 year old white female, new to GI today referred by Dr. Tedra Senegal after a recent ER visit with an isolated episode of rectal bleeding. Patient was seen in the ER on 09/07/1999 1:22 episode of bright red blood per rectum which occurred with a bowel movement.  Patient had no associated complaints of abdominal pain or rectal pain.  It was noted that she had not had prior colonoscopy. On rectal exam in the ER she was noted to have some external skin tags, there is no gross blood noted, stool was sent for Hemoccult which was negative. CBC showed hemoglobin of 15.1/hematocrit of 46. She was then seen by her PCP in follow-up, and had a anoscopy done which by Dr. Verlene Mayer notes was negative.,  No stool or heme noted  Patient says that she has had long-term issues with very random intermittent small-volume hematochezia.  She says she may have an episode once or twice per year and always occurs with straining or hard stool.  She had shown her husband the blood after this episode in December and that had precipitated the work-up.  She says she has not seen any more blood since that time and has been paying attention to her bowel movements.  She denies any ongoing issues with constipation or straining.  She has no complaints of abdominal pain.  No rectal discomfort or pain.  She has no family history of colon cancer that she is aware of. She does have history of hypertension, anxiety, and hypothyroidism.  She is not sure that she wants to proceed with colonoscopy.  Review of Systems Pertinent positive and negative review of systems were noted in the above HPI section.  All other review of systems was otherwise negative.   Outpatient Encounter Medications as of 10/30/2021  Medication Sig   ALPRAZolam (XANAX) 0.5 MG tablet One tab q am and 2 tabs hs   cholecalciferol (VITAMIN D) 1000  UNITS tablet Take 2,000 Units by mouth daily.   hydrochlorothiazide (HYDRODIURIL) 25 MG tablet TAKE 1 TABLET BY MOUTH EVERY DAY   levothyroxine (SYNTHROID) 75 MCG tablet TAKE 1 TABLET BY MOUTH EVERY DAY   losartan (COZAAR) 50 MG tablet TAKE 1 TABLET (50 MG TOTAL) BY MOUTH DAILY. DISCONTINUE LOSARTAN 100MG    metoprolol tartrate (LOPRESSOR) 50 MG tablet TAKE 1 TABLET BY MOUTH EVERY DAY   simvastatin (ZOCOR) 20 MG tablet TAKE 1 TABLET BY MOUTH EVERY DAY   Facility-Administered Encounter Medications as of 10/30/2021  Medication   ipratropium (ATROVENT) 0.06 % nasal spray 2 spray   No Known Allergies Patient Active Problem List   Diagnosis Date Noted   Menopausal syndrome (hot flashes) 06/30/2012   Hypertension 12/24/2011   Anxiety 12/24/2011   Hypothyroidism 06/17/2011   Hyperlipidemia 06/17/2011   Social History   Socioeconomic History   Marital status: Married    Spouse name: Not on file   Number of children: Not on file   Years of education: Not on file   Highest education level: Not on file  Occupational History   Not on file  Tobacco Use   Smoking status: Never   Smokeless tobacco: Never  Substance and Sexual Activity   Alcohol use: Never   Drug use: No   Sexual activity: Not on file  Other Topics Concern   Not on file  Social History Narrative  Not on file   Social Determinants of Health   Financial Resource Strain: Not on file  Food Insecurity: Not on file  Transportation Needs: Not on file  Physical Activity: Not on file  Stress: Not on file  Social Connections: Not on file  Intimate Partner Violence: Not on file    Ms. Cunningham's family history includes Cancer in her father.      Objective:    Vitals:   10/30/21 1038  BP: 138/80  Pulse: 64    Physical Exam Well-developed well-nourished elderly white female in no acute distress.  Accompanied by her husband  Weight, 156 BMI 28.5  HEENT; nontraumatic normocephalic, EOMI, PE R LA, sclera  anicteric. Oropharynx; not examined today Neck; supple, no JVD Cardiovascular; regular rate and rhythm with S1-S2, no murmur rub or gallop Pulmonary; Clear bilaterally Abdomen; soft, nontender, nondistended, no palpable mass or hepatosplenomegaly, bowel sounds are active Rectal; not done today, this was done recently by her PCP including anoscopy which was unremarkable Skin; benign exam, no jaundice rash or appreciable lesions Extremities; no clubbing cyanosis or edema skin warm and dry Neuro/Psych; alert and oriented x4, grossly nonfocal mood and affect appropriate        Assessment & Plan:   #39 81 year old white female with very occasional episode of bright red blood per rectum occurring with a bowel movement once or twice per year over the past many years. Patient had a similar episode in December, was seen and evaluated in the emergency room with negative rectal exam, stool was actually Hemoccult negative and hemoglobin 15.1. Subsequent anoscopy by her PCP/Dr. Renold Genta unremarkable Patient has not had any further episodes of bleeding since December 1  Patient feels that her bleeding has been related to hemorrhoids when this has occurred over the past many years.  She has not had prior colonoscopy for colon cancer screening.  No other current GI symptoms, specifically denies any changes in her bowel habits, abdominal pain, rectal discomfort.  Suspect that her bleeding is related to small hemorrhoids, cannot rule out other occult colonic lesion.  #2 chronic anxiety 3.  Hypertension 4.  Hypothyroidism  Plan; Long discussion today with the patient regarding colonoscopy, prep, sedation, risks and benefits.  I discussed that screening colonoscopy is not indicated after 80 however it is reasonable to consider colonoscopy because she had had the episode of rectal bleeding.  She was initially agreeable to colonoscopy, however after reading the informed consent papers while being scheduled  she decided that she did not want to pursue.  We are happy to see her in follow-up if she has recurrent issues.  Alfredia Ferguson PA-C 10/30/2021   Cc: Elby Showers, MD

## 2021-11-07 ENCOUNTER — Other Ambulatory Visit: Payer: Self-pay | Admitting: Internal Medicine

## 2021-11-20 ENCOUNTER — Other Ambulatory Visit: Payer: Self-pay | Admitting: Internal Medicine

## 2021-11-28 ENCOUNTER — Telehealth: Payer: Self-pay

## 2021-11-28 NOTE — Telephone Encounter (Signed)
Called and spoke with [name], she is stopped up, lot of congestion, no fever, runny nose, mucus is clear, sore throat, little better today than yesterday. Will call back with COVID test results, has not taking test yet. I did ask to call early morning to be sure and get an appointment tomorrow.

## 2021-11-28 NOTE — Telephone Encounter (Signed)
Patients husband called stating that the patient has a sore throat and congestion and she is whiney. He has been advisee that she can go to urgent care or do a virtual with another provider. He states that she wants to see Dr Renold Genta. He has been advised that she will need to take a covid test and let us know results prior to making appt. He will call back later after testing.

## 2021-11-29 ENCOUNTER — Ambulatory Visit (INDEPENDENT_AMBULATORY_CARE_PROVIDER_SITE_OTHER): Payer: Medicare HMO | Admitting: Internal Medicine

## 2021-11-29 ENCOUNTER — Encounter: Payer: Self-pay | Admitting: Internal Medicine

## 2021-11-29 ENCOUNTER — Other Ambulatory Visit: Payer: Self-pay

## 2021-11-29 VITALS — BP 128/78 | HR 80 | Temp 97.8°F

## 2021-11-29 DIAGNOSIS — J069 Acute upper respiratory infection, unspecified: Secondary | ICD-10-CM

## 2021-11-29 DIAGNOSIS — H6503 Acute serous otitis media, bilateral: Secondary | ICD-10-CM

## 2021-11-29 DIAGNOSIS — J029 Acute pharyngitis, unspecified: Secondary | ICD-10-CM | POA: Diagnosis not present

## 2021-11-29 LAB — POCT RAPID STREP A (OFFICE): Rapid Strep A Screen: NEGATIVE

## 2021-11-29 MED ORDER — BENZONATATE 100 MG PO CAPS: 100.0000 mg | ORAL_CAPSULE | Freq: Three times a day (TID) | ORAL | 0 refills | Status: DC | PRN

## 2021-11-29 MED ORDER — AZITHROMYCIN 250 MG PO TABS: ORAL_TABLET | ORAL | 0 refills | Status: DC

## 2021-11-29 NOTE — Patient Instructions (Addendum)
Your strep screen is negative.  I believe you have an acute lower respiratory infection.  You also appear to have a bilateral serous otitis media and a nonstrep pharyngitis.  Please take Zithromax Z-PAK 2 tabs day 1 followed by 1 tab days 2 through 5.  May take Tessalon Perles 100 mg 3 times daily as needed for cough.  Rest and drink fluids.  Wear your mask around others for the next 10 days and consider wearing it in crowds such as church, grocery shopping or public events.

## 2021-11-29 NOTE — Telephone Encounter (Signed)
Patient took a covid test this am and it was negative. Needs an afternoon appt if possible.

## 2021-11-29 NOTE — Progress Notes (Signed)
° °  Subjective:    Patient ID: [name], female    DOB: 02/23/1941, 81 y.o.   MRN: [id number]  HPI 81 year old Female seen with respiratory congestion, cough, sore throat and fatigue. Has runny nose.Went to church this past Sunday and out shopping earlier in the week. Has not been wearing a mask in  public recently.  She has a history of hypothyroidism, hypertension, hyperlipidemia and anxiety.  Rapid strep screen is negative.   Review of Systems no fever or chills. No vomitng or nausea.  Wants to attend family birthday party this weekend.Took Home Covid test that was negative.     Objective:   Physical Exam BP 128/78 pulse 80 T 97.8 degrees pulse ox 98%  She looks fatigued.  Her pharynx is injected.  Rapid strep screen is negative.  No exudate noted in pharynx.  TMs are slightly full bilaterally.  Neck supple.  Chest clear.     Assessment & Plan:  Acute pharyngitis-non strep  Acute respiratory infection  Cough-lungs are clear  Bilateral serous otitis media  Plan: Zithromax Z-PAK 2 tabs day 1 followed by 1 tab days 2 through 5.  Tessalon Perles 100 mg 3 times daily as needed for cough.

## 2021-11-29 NOTE — Telephone Encounter (Signed)
Scheduled

## 2021-12-10 DIAGNOSIS — H524 Presbyopia: Secondary | ICD-10-CM | POA: Diagnosis not present

## 2021-12-11 ENCOUNTER — Encounter: Payer: Medicare HMO | Admitting: Internal Medicine

## 2021-12-22 ENCOUNTER — Other Ambulatory Visit: Payer: Self-pay | Admitting: Internal Medicine

## 2022-01-02 DIAGNOSIS — H52223 Regular astigmatism, bilateral: Secondary | ICD-10-CM | POA: Diagnosis not present

## 2022-01-02 DIAGNOSIS — H524 Presbyopia: Secondary | ICD-10-CM | POA: Diagnosis not present

## 2022-01-02 DIAGNOSIS — Z961 Presence of intraocular lens: Secondary | ICD-10-CM | POA: Diagnosis not present

## 2022-01-25 ENCOUNTER — Other Ambulatory Visit: Payer: Medicare HMO

## 2022-01-25 DIAGNOSIS — R7301 Impaired fasting glucose: Secondary | ICD-10-CM

## 2022-01-25 DIAGNOSIS — E782 Mixed hyperlipidemia: Secondary | ICD-10-CM | POA: Diagnosis not present

## 2022-01-25 DIAGNOSIS — E039 Hypothyroidism, unspecified: Secondary | ICD-10-CM | POA: Diagnosis not present

## 2022-01-26 LAB — LIPID PANEL
Cholesterol: 166 mg/dL (ref <200)
HDL: 63 mg/dL (ref >=50)
LDL Cholesterol (Calc): 83 mg/dL (calc)
Non-HDL Cholesterol (Calc): 103 mg/dL (calc) (ref <130)
Total CHOL/HDL Ratio: 2.6 (calc) (ref <5.0)
Triglycerides: 102 mg/dL (ref <150)

## 2022-01-26 LAB — HEMOGLOBIN A1C
Hgb A1c MFr Bld: 5.5 % of total Hgb (ref <5.7)
Mean Plasma Glucose: 111 mg/dL
eAG (mmol/L): 6.2 mmol/L

## 2022-01-26 LAB — TSH: TSH: 1.59 mIU/L (ref 0.40–4.50)

## 2022-01-29 ENCOUNTER — Encounter: Payer: Self-pay | Admitting: Internal Medicine

## 2022-01-29 ENCOUNTER — Ambulatory Visit (INDEPENDENT_AMBULATORY_CARE_PROVIDER_SITE_OTHER): Payer: Medicare HMO | Admitting: Internal Medicine

## 2022-01-29 VITALS — BP 118/74 | HR 65 | Temp 97.8°F | Ht 62.0 in | Wt 153.5 lb

## 2022-01-29 DIAGNOSIS — E039 Hypothyroidism, unspecified: Secondary | ICD-10-CM

## 2022-01-29 DIAGNOSIS — R0989 Other specified symptoms and signs involving the circulatory and respiratory systems: Secondary | ICD-10-CM

## 2022-01-29 DIAGNOSIS — Z6828 Body mass index (BMI) 28.0-28.9, adult: Secondary | ICD-10-CM

## 2022-01-29 DIAGNOSIS — F411 Generalized anxiety disorder: Secondary | ICD-10-CM

## 2022-01-29 DIAGNOSIS — Z8719 Personal history of other diseases of the digestive system: Secondary | ICD-10-CM | POA: Diagnosis not present

## 2022-01-29 DIAGNOSIS — I10 Essential (primary) hypertension: Secondary | ICD-10-CM

## 2022-01-29 DIAGNOSIS — R69 Illness, unspecified: Secondary | ICD-10-CM | POA: Diagnosis not present

## 2022-01-29 DIAGNOSIS — H903 Sensorineural hearing loss, bilateral: Secondary | ICD-10-CM

## 2022-01-29 DIAGNOSIS — E782 Mixed hyperlipidemia: Secondary | ICD-10-CM

## 2022-01-29 NOTE — Progress Notes (Signed)
? ?  Subjective:  ? ? Patient ID: [name], female    DOB: [birth year], 81 y.o.   MRN: [id number] ? ?HPI 81 year old Female for 6 month recheck. Has lost  5 pounds since October 2022. Suggest  that she lose about 10 pounds. I would like her BMI at  about 25. ? ?Reminded about mammogram. ? ?Labs reviewed. Hgb AIC 5.5%. TSH 1.59. Lipid  panel  completely normal. ? ?Has had recent eye exam. ? ?Recommend Shingrix vaccine at pharmacy. Last Covid booster November 2022. Gets annual flu vaccine. Tdap good until 2024. ? ?Had Cologard 2021 ordered which was not completed by patient. ? ?She had some rectal bleeding in December 2022.  Was seen in the emergency department at that time.  Fecal occult blood in the emergency department was negative.  Has not had further recurrence.  Anoscopy here in December was negative.  She was seen by Nicoletta Ba at [facility].  Patient told her she might have an episode of rectal bleeding once or twice a year with straining or hard stool.  Patient was ambivalent about colonoscopy.  Subsequently decided by the end of the visit she did not want to go through with colonoscopy. ? ?She had Medicare annual wellness visit in October 2022 ? ?Review of Systems Has bilateral hearing aids ? ?   ?Objective:  ? Physical Exam ? BP 118/74 pulse 65 T 97.8 degrees Pulse ox 99% Weight 153 pounds 8 oz. BMI 28.08 ? ?Skin: Warm and dry.  Nodes none.  Neck is supple.  Left carotid bruit longstanding.  Chest is clear.  Cardiac exam regular rate and rhythm without ectopy.  No lower extremity edema. ?  ?Labs reviewed including normal TSH, normal lipid panel, normal hemoglobin A1c. ? ?   ?Assessment & Plan:  ?Essential hypertension-stable on low-dose losartan and HCTZ as well as Lopressor 50 mg daily. ? ?Hyperlipidemia on Zocor generic 20 mg daily and lipid panel was within normal limits ? ?Hypothyroidism treated with levothyroxine 75 mcg daily and TSH is normal ? ?History of rectal bleeding in December 2022-no  further episodes and she declined colonoscopy ? ?Anxiety treated with Xanax 1 tablet in the morning and 2 tablets at bedtime ? 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 ? ?Sensorineural hearing loss-wears bilateral hearing aids ? ?Plan: Return fall 2023 for Medicare wellness and health maintenance exam.  Continue current medications.  Continue diet exercise and weight loss regimen. ? ?

## 2022-01-30 NOTE — Patient Instructions (Addendum)
It was a pleasure to see you today.  Labs are stable.  Try to lose a few more pounds with diet and exercise efforts.  Follow-up in 6 months.  Continue current medications. ?

## 2022-02-08 ENCOUNTER — Other Ambulatory Visit: Payer: Self-pay | Admitting: Internal Medicine

## 2022-05-12 ENCOUNTER — Other Ambulatory Visit: Payer: Self-pay | Admitting: Internal Medicine

## 2022-08-10 ENCOUNTER — Other Ambulatory Visit: Payer: Self-pay | Admitting: Internal Medicine

## 2022-08-16 ENCOUNTER — Other Ambulatory Visit: Payer: Medicare HMO

## 2022-08-16 DIAGNOSIS — E039 Hypothyroidism, unspecified: Secondary | ICD-10-CM | POA: Diagnosis not present

## 2022-08-16 DIAGNOSIS — I10 Essential (primary) hypertension: Secondary | ICD-10-CM | POA: Diagnosis not present

## 2022-08-16 DIAGNOSIS — E782 Mixed hyperlipidemia: Secondary | ICD-10-CM

## 2022-08-16 DIAGNOSIS — E7800 Pure hypercholesterolemia, unspecified: Secondary | ICD-10-CM

## 2022-08-17 LAB — CBC WITH DIFFERENTIAL/PLATELET
Absolute Monocytes: 601 cells/uL (ref 200–950)
Basophils Absolute: 37 cells/uL (ref 0–200)
Basophils Relative: 0.6 %
Eosinophils Absolute: 93 cells/uL (ref 15–500)
Eosinophils Relative: 1.5 %
HCT: 44.5 % (ref 35.0–45.0)
Hemoglobin: 15 g/dL (ref 11.7–15.5)
Lymphs Abs: 1724 cells/uL (ref 850–3900)
MCH: 29.3 pg (ref 27.0–33.0)
MCHC: 33.7 g/dL (ref 32.0–36.0)
MCV: 86.9 fL (ref 80.0–100.0)
MPV: 10.6 fL (ref 7.5–12.5)
Monocytes Relative: 9.7 %
Neutro Abs: 3745 cells/uL (ref 1500–7800)
Neutrophils Relative %: 60.4 %
Platelets: 289 10*3/uL (ref 140–400)
RBC: 5.12 10*6/uL — ABNORMAL HIGH (ref 3.80–5.10)
RDW: 13 % (ref 11.0–15.0)
Total Lymphocyte: 27.8 %
WBC: 6.2 10*3/uL (ref 3.8–10.8)

## 2022-08-17 LAB — COMPLETE METABOLIC PANEL WITH GFR
AG Ratio: 2 (calc) (ref 1.0–2.5)
ALT: 14 U/L (ref 6–29)
AST: 16 U/L (ref 10–35)
Albumin: 4.4 g/dL (ref 3.6–5.1)
Alkaline phosphatase (APISO): 58 U/L (ref 37–153)
BUN: 23 mg/dL (ref 7–25)
CO2: 29 mmol/L (ref 20–32)
Calcium: 10 mg/dL (ref 8.6–10.4)
Chloride: 100 mmol/L (ref 98–110)
Creat: 0.91 mg/dL (ref 0.60–0.95)
Globulin: 2.2 g/dL (calc) (ref 1.9–3.7)
Glucose, Bld: 97 mg/dL (ref 65–99)
Potassium: 4.2 mmol/L (ref 3.5–5.3)
Sodium: 137 mmol/L (ref 135–146)
Total Bilirubin: 0.5 mg/dL (ref 0.2–1.2)
Total Protein: 6.6 g/dL (ref 6.1–8.1)
eGFR: 63 mL/min/{1.73_m2} (ref >=60)

## 2022-08-17 LAB — LIPID PANEL
Cholesterol: 178 mg/dL (ref <200)
HDL: 72 mg/dL (ref >=50)
LDL Cholesterol (Calc): 87 mg/dL (calc)
Non-HDL Cholesterol (Calc): 106 mg/dL (calc) (ref <130)
Total CHOL/HDL Ratio: 2.5 (calc) (ref <5.0)
Triglycerides: 98 mg/dL (ref <150)

## 2022-08-17 LAB — TSH: TSH: 3.43 mIU/L (ref 0.40–4.50)

## 2022-08-20 ENCOUNTER — Encounter: Payer: Self-pay | Admitting: Internal Medicine

## 2022-08-20 ENCOUNTER — Ambulatory Visit (INDEPENDENT_AMBULATORY_CARE_PROVIDER_SITE_OTHER): Payer: Medicare HMO | Admitting: Internal Medicine

## 2022-08-20 VITALS — BP 140/70 | HR 71 | Temp 98.5°F | Ht 62.0 in | Wt 155.4 lb

## 2022-08-20 DIAGNOSIS — G4709 Other insomnia: Secondary | ICD-10-CM

## 2022-08-20 DIAGNOSIS — K580 Irritable bowel syndrome with diarrhea: Secondary | ICD-10-CM | POA: Diagnosis not present

## 2022-08-20 DIAGNOSIS — I499 Cardiac arrhythmia, unspecified: Secondary | ICD-10-CM | POA: Diagnosis not present

## 2022-08-20 DIAGNOSIS — H903 Sensorineural hearing loss, bilateral: Secondary | ICD-10-CM

## 2022-08-20 DIAGNOSIS — Z6828 Body mass index (BMI) 28.0-28.9, adult: Secondary | ICD-10-CM

## 2022-08-20 DIAGNOSIS — Z7185 Encounter for immunization safety counseling: Secondary | ICD-10-CM | POA: Diagnosis not present

## 2022-08-20 DIAGNOSIS — E063 Autoimmune thyroiditis: Secondary | ICD-10-CM | POA: Diagnosis not present

## 2022-08-20 DIAGNOSIS — F411 Generalized anxiety disorder: Secondary | ICD-10-CM

## 2022-08-20 DIAGNOSIS — Z0000 Encounter for general adult medical examination without abnormal findings: Secondary | ICD-10-CM | POA: Diagnosis not present

## 2022-08-20 DIAGNOSIS — I10 Essential (primary) hypertension: Secondary | ICD-10-CM

## 2022-08-20 DIAGNOSIS — Z23 Encounter for immunization: Secondary | ICD-10-CM | POA: Diagnosis not present

## 2022-08-20 DIAGNOSIS — R69 Illness, unspecified: Secondary | ICD-10-CM | POA: Diagnosis not present

## 2022-08-20 DIAGNOSIS — J3489 Other specified disorders of nose and nasal sinuses: Secondary | ICD-10-CM

## 2022-08-20 LAB — POCT URINALYSIS DIPSTICK
Bilirubin, UA: NEGATIVE
Blood, UA: NEGATIVE
Glucose, UA: NEGATIVE
Ketones, UA: NEGATIVE
Leukocytes, UA: NEGATIVE
Nitrite, UA: NEGATIVE
Protein, UA: NEGATIVE
Spec Grav, UA: 1.01 (ref 1.010–1.025)
Urobilinogen, UA: 0.2 E.U./dL
pH, UA: 5 (ref 5.0–8.0)

## 2022-08-20 NOTE — Progress Notes (Signed)
Annual Wellness Visit     Patient: [name], Female    DOB: 1941/03/28, 81 y.o.   MRN: [id number] Visit Date: 08/20/2022   Subjective    [name] is a 81 y.o. female who presents today for her Annual Wellness Visit.  HPI She also presents for annual health maintenance exam and evaluation of medical issues.  She has a history of hypothyroidism, hyperlipidemia, hypertension and anxiety.  History of left carotid bruit.  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  Has had no neurological symptoms.  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  Has declined colonoscopy in the past.  She underwent hysterectomy in 2002 with bilateral oophorectomy.  History of ophthalmic migraine 1993.  History of pneumonia 2003.  Social history: She is married.  Completed high school.  She is retired.  Does not smoke or consume alcohol.  Family history: Father died of cancer at age 39.  He had history of MI.  Mother with history of hypertension.  Sister with history of hypertension.   Labs reviewed and are entirely within normal limits including CBC with differential, c-Met, urine dipstick, lipid panel and TSH.  No recent mammogram despite ordering these for her on an annual basis.  Screening mammogram was ordered today.  Had vision exam in [facility] in March 2023.  Was seen in the emergency department December 2022 with bright red blood per rectum.  Stools were formed and not bloody.  She has history of irritable bowel syndrome.  Has not wanted to have colonoscopy.  Occult blood testing in the emergency department was negative.  No further issues.  Did see Nicoletta Ba, [place] January 2023.  Colonoscopy was suggested but patient did not want to proceed. .    Patient Care Team: Elby Showers, MD as PCP - General (Internal Medicine)  Review of Systems no new  complaints-feels well   Objective    Vitals: Reviewed blood pressure on arrival 150/74 and repeated a few minutes later 140/70.  I think she has office hypertension.  Pulse 71.  Pulse oximetry 98% on room air, weight 155 pounds 6.4 ounces, BMI 28.42, height 5 feet 2 inches  Physical Exam Skin: Warm and dry.  No cervical adenopathy, thyromegaly.  No carotid bruits are heard today.  Chest clear.  Cardiac exam frequent irregular contractions and EKG shows sinus arrhythmia and possible PACs.  Abdomen: Is soft nondistended without hepatosplenomegaly masses or tenderness.  Breasts: Are without masses.  No lower extremity pitting edema.  Affect thought and judgment appear to be normal.  Brief neurological exam is intact without gross focal deficits.   Most recent functional status assessment:     No data to display         Most recent fall risk assessment:    08/20/2022   10:06 AM  [place] in the past year? 0  Number falls in past yr: 0  Risk for fall due to : No Fall Risks  Follow up Falls evaluation completed    Most recent depression screenings:    08/20/2022   10:06 AM 09/11/2021   12:05 PM  PHQ 2/9 Scores  PHQ - 2 Score 0 0   Most recent cognitive screening:    07/20/2021   10:10 AM  6CIT Screen  What Year? 0 points  What month?  0 points  What time? 0 points  Count back from 20 0 points  Months in reverse 0 points  Repeat phrase 10 points  Total Score 10 points       Assessment & Plan   PACs and possible sinus arrhythmia.  Have suggested patient wear a Zio monitor.  This will be ordered.  History of left carotid bruit but I do not appreciate bruit today.  Carotid duplex in 1999 showed 25 to 30% stenosis of right internal carotid artery and 30 to 35% stenosis of the left internal carotid artery.  She has declined colonoscopy and has not had recent mammogram.  Vaccines discussed  Bilateral hearing loss and wears hearing aids  Hypothyroidism treated  with thyroid replacement medication consisting of levothyroxine 75 mcg daily  History of insomnia treated with Xanax  History of anxiety treated with Xanax  Hypertension stable on current regimen consisting of HCTZ, losartan and metoprolol  Hyperlipidemia treated with Zocor 20 mg daily    Plan: Will have patient wear ZIO monitor to determine if she has significant arrhythmia.  She is active and alert.  Has hearing loss and wears hearing aids      Annual wellness visit done today including the all of the following: Reviewed patient's Family Medical History Reviewed and updated list of patient's medical providers Assessment of cognitive impairment was done Assessed patient's functional ability Established a written schedule for health screening [place] Completed and Reviewed  Discussed health benefits of physical activity, and encouraged her to engage in regular exercise appropriate for her age and condition.    IElby Showers, MD, have reviewed all documentation for this visit. The documentation on 09/01/22 for the exam, diagnosis, procedures, and orders are all accurate and complete.        LaVon Barron Alvine, CMA

## 2022-09-01 DIAGNOSIS — H9193 Unspecified hearing loss, bilateral: Secondary | ICD-10-CM | POA: Insufficient documentation

## 2022-09-01 DIAGNOSIS — G4700 Insomnia, unspecified: Secondary | ICD-10-CM | POA: Insufficient documentation

## 2022-09-01 DIAGNOSIS — I499 Cardiac arrhythmia, unspecified: Secondary | ICD-10-CM | POA: Insufficient documentation

## 2022-09-01 NOTE — Patient Instructions (Signed)
Would like for patient to wear a ZIO monitor for several days.  This will be ordered and arranged.  Continue current medications and follow-up in 1 year or as needed.  Vaccines discussed.  She declined colonoscopy despite episode of rectal bleeding.  Has not had recent mammogram.  Continue current medications.

## 2022-09-02 ENCOUNTER — Ambulatory Visit: Payer: Medicare HMO | Attending: Internal Medicine

## 2022-09-02 ENCOUNTER — Encounter: Payer: Self-pay | Admitting: *Deleted

## 2022-09-02 ENCOUNTER — Other Ambulatory Visit: Payer: Self-pay

## 2022-09-02 ENCOUNTER — Other Ambulatory Visit: Payer: Self-pay | Admitting: Internal Medicine

## 2022-09-02 DIAGNOSIS — I499 Cardiac arrhythmia, unspecified: Secondary | ICD-10-CM

## 2022-09-02 DIAGNOSIS — I491 Atrial premature depolarization: Secondary | ICD-10-CM

## 2022-09-02 NOTE — Progress Notes (Unsigned)
Enrolled for [employer] to mail a ZIO XT long term holter monitor to the patients address on file.   Letter with instructions mailed to patient.  DOD to read. 

## 2022-09-06 ENCOUNTER — Ambulatory Visit
Admission: RE | Admit: 2022-09-06 | Discharge: 2022-09-06 | Disposition: A | Discharge status: Home / Self Care | Payer: Medicare HMO | Source: Ambulatory Visit | Attending: Internal Medicine | Admitting: Internal Medicine

## 2022-09-06 DIAGNOSIS — Z1231 Encounter for screening mammogram for malignant neoplasm of breast: Secondary | ICD-10-CM | POA: Diagnosis not present

## 2022-09-10 ENCOUNTER — Other Ambulatory Visit: Payer: Self-pay | Admitting: Internal Medicine

## 2022-09-10 DIAGNOSIS — I499 Cardiac arrhythmia, unspecified: Secondary | ICD-10-CM | POA: Diagnosis not present

## 2022-09-10 DIAGNOSIS — R928 Other abnormal and inconclusive findings on diagnostic imaging of breast: Secondary | ICD-10-CM

## 2022-09-10 DIAGNOSIS — I491 Atrial premature depolarization: Secondary | ICD-10-CM

## 2022-09-19 ENCOUNTER — Ambulatory Visit
Admission: RE | Admit: 2022-09-19 | Discharge: 2022-09-19 | Disposition: A | Discharge status: Home / Self Care | Payer: Medicare HMO | Source: Ambulatory Visit | Attending: Internal Medicine | Admitting: Internal Medicine

## 2022-09-19 DIAGNOSIS — R928 Other abnormal and inconclusive findings on diagnostic imaging of breast: Secondary | ICD-10-CM | POA: Diagnosis not present

## 2022-09-19 DIAGNOSIS — N6012 Diffuse cystic mastopathy of left breast: Secondary | ICD-10-CM | POA: Diagnosis not present

## 2022-09-20 DIAGNOSIS — I499 Cardiac arrhythmia, unspecified: Secondary | ICD-10-CM | POA: Diagnosis not present

## 2022-09-25 NOTE — Progress Notes (Signed)
scheduled

## 2022-10-14 ENCOUNTER — Ambulatory Visit (INDEPENDENT_AMBULATORY_CARE_PROVIDER_SITE_OTHER): Payer: Medicare HMO | Admitting: Internal Medicine

## 2022-10-14 ENCOUNTER — Encounter: Payer: Self-pay | Admitting: Internal Medicine

## 2022-10-14 ENCOUNTER — Other Ambulatory Visit: Payer: Self-pay

## 2022-10-14 VITALS — BP 130/68 | HR 66 | Temp 98.0°F | Ht 62.0 in | Wt 156.4 lb

## 2022-10-14 DIAGNOSIS — Z23 Encounter for immunization: Secondary | ICD-10-CM

## 2022-10-14 DIAGNOSIS — I471 Supraventricular tachycardia, unspecified: Secondary | ICD-10-CM

## 2022-10-14 MED ORDER — ALPRAZOLAM 0.5 MG PO TABS: ORAL_TABLET | ORAL | 3 refills | Status: DC

## 2022-10-14 NOTE — Progress Notes (Signed)
   Subjective:    Patient ID: [name], female    DOB: [birth year], 82 y.o.   MRN: [id number]  HPI 82 year old Female seen today with her husband present to discuss recent heart monitor results.  She was here in November for Medicare annual wellness visit and health maintenance exam.  She has a history of hypothyroidism, hyperlipidemia, hypertension and anxiety.  History of left carotid bruit.  Carotid duplex study in 1999.  No neurological symptoms with the exception of sensorineural hearing loss and wears bilateral hearing aids.  She does not smoke or consume alcohol.  On exam in November she had no carotid bruits.  Cardiac exam revealed frequent irregular contractions.  EKG showed sinus arrhythmia and possible PACs.  Suggested patient wear a Zio monitor to make sure she was not having episodes of atrial fibrillation.  Was advised to continue HCTZ, losartan and metoprolol.  Was advised to continue Zocor 20 mg daily.  Long-term Zio monitor results showed a maximum heart rate of 179 bpm and an average heart rate of 68 bpm.  Underlying rhythm was sinus rhythm but she had 54 runs of SVT with the fastest interval lasting 11 beats maximum rate 179 bpm.  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  When she was hearing this news she became even more anxious.  Review of Systems no chest pain, shortness of breath, diaphoresis     Objective:   Physical Exam  Blood pressure 130/68, pulse 66, temperature 98 degrees pulse oximetry 98% weight 156 pounds 6.4 ounces BMI 28.61  Skin: Warm and dry.  No cervical adenopathy.  I cannot appreciate any carotid bruits.  Her chest is clear.  She has frequent irregular contractions on cardiac exam.  No lower extremity pitting edema.    Assessment & Plan:   Nonsustained SVT-frequent episodes-patient does not seem to be aware of this  Rare PACs  Rare PVCs  Plan: Patient's husband and I would like for her to have  cardiology evaluation to see if she would benefit from additional medication or other medication.  She is on metoprolol 50 mg daily in addition to losartan 50 mg daily.  She is on thyroid replacement medication levothyroxine 75 mcg daily and TSH in November was 3.43.  Flu vaccine given.  Return for follow-up here in May.

## 2022-11-03 NOTE — Patient Instructions (Addendum)
Cardiology evaluation will be requested for your arrhythmia.  Flu vaccine given today.  Return for follow-up here in May.

## 2022-11-06 ENCOUNTER — Other Ambulatory Visit: Payer: Self-pay | Admitting: Internal Medicine

## 2022-11-19 NOTE — Progress Notes (Signed)
CARDIOLOGY CONSULT NOTE       Patient ID: JAMILETTE COLYAR MRN: [id number] DOB/AGE: 84-03-1941 84 y.o.  Admit date: (Not on file) Referring Physician: Baxley Primary Physician: Elby Showers, MD Primary Cardiologist: New Reason for Consultation: Arrhythmia/Palpitations  Active Problems:   * No active hospital problems. *   HPI:  82 y.o. referred by Dr Renold Genta for arrhythmia and palpitations History of HLD, HTN, Anxiety hypothyroidism and hearing loss ECG with sinus arrhythmia and PAC;s  Monitor 09/22/22 Rare PAC/PVC  Longest duration 16 beats at HR 118 fastes only 11 beats rate 179.  She is on lopressor for HTN.  S She is on synthroid replacement with normal TSH 08/16/22  With diuretic for her BP K 4.2 normal renal function   On statin LDL 87   She has no presyncope, chest pain Very limited skips and no long episodes of rapid palpitations ECG in office today with sinus arrhythmia   ROS All other systems reviewed and negative except as noted above  Past Medical History:  Diagnosis Date   Anxiety    Hearing loss of both ears    History of migraines    Hyperlipidemia    Hypertension     Family History  Problem Relation Age of Onset   Cancer Father    Breast cancer Neg Hx     Social History   Socioeconomic History   Marital status: Married    Spouse name: Not on file   Number of children: Not on file   Years of education: Not on file   Highest education level: Not on file  Occupational History   Not on file  Tobacco Use   Smoking status: Never   Smokeless tobacco: Never  Substance and Sexual Activity   Alcohol use: Never   Drug use: No   Sexual activity: Not on file  Other Topics Concern   Not on file  Social History Narrative   Not on file   Social Determinants of Health   Financial Resource Strain: Not on file  Food Insecurity: Not on file  Transportation Needs: Not on file  Physical Activity: Not on file  Stress: Not on file  Social Connections: Not  on file  Intimate Partner Violence: Not on file    Past Surgical History:  Procedure Laterality Date   ABDOMINAL HYSTERECTOMY     carotid bruit     left   hearing loss     bilateral   migraine         Current Outpatient Medications:    ALPRAZolam (XANAX) 0.5 MG tablet, One tab q am and 2 tabs hs, Disp: 90 tablet, Rfl: 3   cholecalciferol (VITAMIN D) 1000 UNITS tablet, Take 2,000 Units by mouth daily., Disp: , Rfl:    fluticasone (FLONASE) 50 MCG/ACT nasal spray, Place into both nostrils daily., Disp: , Rfl:    hydrochlorothiazide (HYDRODIURIL) 25 MG tablet, TAKE 1 TABLET BY MOUTH EVERY DAY, Disp: 90 tablet, Rfl: 3   levothyroxine (SYNTHROID) 75 MCG tablet, TAKE 1 TABLET BY MOUTH EVERY DAY, Disp: 90 tablet, Rfl: 1   losartan (COZAAR) 50 MG tablet, TAKE 1 TABLET (50 MG TOTAL) BY MOUTH DAILY. DISCONTINUE LOSARTAN '100MG'$ , Disp: 90 tablet, Rfl: 3   metoprolol tartrate (LOPRESSOR) 50 MG tablet, TAKE 1 TABLET BY MOUTH EVERY DAY, Disp: 90 tablet, Rfl: 3   simvastatin (ZOCOR) 20 MG tablet, TAKE 1 TABLET BY MOUTH EVERY DAY, Disp: 90 tablet, Rfl: 3  Current Facility-Administered Medications:  ipratropium (ATROVENT) 0.06 % nasal spray 2 spray, 2 spray, Each Nare, TID, Baxley, Cresenciano Lick, MD  ipratropium  2 spray Each Nare TID     Physical Exam: There were no vitals taken for this visit.    Affect appropriate Healthy:  appears stated age 82: normal Neck supple with no adenopathy JVP normal no bruits no thyromegaly Lungs clear with no wheezing and good diaphragmatic motion Heart:  S1/S2 no murmur, no rub, gallop or click PMI normal Abdomen: benighn, BS positve, no tenderness, no AAA no bruit.  No HSM or HJR Distal pulses intact with no bruits No edema Neuro non-focal Skin warm and dry No muscular weakness  Labs:   Lab Results  Component Value Date   WBC 6.2 08/16/2022   HGB 15.0 08/16/2022   HCT 44.5 08/16/2022   MCV 86.9 08/16/2022   PLT 289 08/16/2022   No results for  input(s): "NA", "K", "CL", "CO2", "BUN", "CREATININE", "CALCIUM", "PROT", "BILITOT", "ALKPHOS", "ALT", "AST", "GLUCOSE" in the last 168 hours.  Invalid input(s): "LABALBU" No results found for: "CKTOTAL", "CKMB", "CKMBINDEX", "TROPONINI"  Lab Results  Component Value Date   CHOL 178 08/16/2022   CHOL 166 01/25/2022   CHOL 195 07/17/2021   Lab Results  Component Value Date   HDL 72 08/16/2022   HDL 63 01/25/2022   HDL 71 07/17/2021   Lab Results  Component Value Date   LDLCALC 87 08/16/2022   LDLCALC 83 01/25/2022   LDLCALC 103 (H) 07/17/2021   Lab Results  Component Value Date   TRIG 98 08/16/2022   TRIG 102 01/25/2022   TRIG 117 07/17/2021   Lab Results  Component Value Date   CHOLHDL 2.5 08/16/2022   CHOLHDL 2.6 01/25/2022   CHOLHDL 2.7 07/17/2021   No results found for: "LDLDIRECT"    Radiology: No results found.  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 On beta blocker in 82 yo would not recommend AAT or ablation referral. TSH/lytes are normal TTE to make sure no structural heart dx Most importantly no evidence of PAF HTN:  continue current meds and DASH diet HLD:  continue statin  Anxiety:  PRN xanax  TTE  F/U cardiology PRN   Signed: Jenkins Rouge 11/19/2022, 12:37 PM

## 2022-11-27 ENCOUNTER — Other Ambulatory Visit: Payer: Self-pay | Admitting: Internal Medicine

## 2022-12-03 ENCOUNTER — Encounter: Payer: Self-pay | Admitting: Cardiovascular Disease

## 2022-12-03 ENCOUNTER — Ambulatory Visit: Payer: Medicare HMO | Attending: Cardiovascular Disease | Admitting: Cardiovascular Disease

## 2022-12-03 VITALS — BP 130/72 | HR 85 | Ht 62.0 in | Wt 158.8 lb

## 2022-12-03 DIAGNOSIS — E782 Mixed hyperlipidemia: Secondary | ICD-10-CM | POA: Diagnosis not present

## 2022-12-03 DIAGNOSIS — F419 Anxiety disorder, unspecified: Secondary | ICD-10-CM | POA: Diagnosis not present

## 2022-12-03 DIAGNOSIS — R002 Palpitations: Secondary | ICD-10-CM

## 2022-12-03 DIAGNOSIS — I499 Cardiac arrhythmia, unspecified: Secondary | ICD-10-CM

## 2022-12-03 DIAGNOSIS — R69 Illness, unspecified: Secondary | ICD-10-CM | POA: Diagnosis not present

## 2022-12-03 NOTE — Patient Instructions (Signed)
Medication Instructions:  Your physician recommends that you continue on your current medications as directed. Please refer to the Current Medication list given to you today.  *If you need a refill on your cardiac medications before your next appointment, please call your pharmacy*  Lab Work: If you have labs (blood work) drawn today and your tests are completely normal, you will receive your results only by: [facility] (if you have MyChart) OR A paper copy in the mail If you have any lab test that is abnormal or we need to change your treatment, we will call you to review the results.  Testing/Procedures: Your physician has requested that you have an echocardiogram. Echocardiography is a painless test that uses sound waves to create images of your heart. It provides your doctor with information about the size and shape of your heart and how well your heart's chambers and valves are working. This procedure takes approximately one hour. There are no restrictions for this procedure. Please do NOT wear cologne, perfume, aftershave, or lotions (deodorant is allowed). Please arrive 15 minutes prior to your appointment time.  Follow-Up: At [facility], you and your health needs are our priority.  As part of our continuing mission to provide you with exceptional heart care, we have created designated Provider Care Teams.  These Care Teams include your primary Cardiologist (physician) and Advanced Practice Providers (APPs -  Physician Assistants and Nurse Practitioners) who all work together to provide you with the care you need, when you need it.  We recommend signing up for the patient portal called "MyChart".  Sign up information is provided on this After Visit Summary.  MyChart is used to connect with patients for Virtual Visits (Telemedicine).  Patients are able to view lab/test results, encounter notes, upcoming appointments, etc.  Non-urgent messages can be sent to your provider as  well.   To learn more about what you can do with MyChart, go to [email or web address].    Your next appointment:   As needed  Provider:   Jenkins Rouge, MD

## 2022-12-12 DIAGNOSIS — H5203 Hypermetropia, bilateral: Secondary | ICD-10-CM | POA: Diagnosis not present

## 2022-12-27 ENCOUNTER — Other Ambulatory Visit: Payer: Self-pay

## 2022-12-27 ENCOUNTER — Other Ambulatory Visit: Payer: Self-pay | Admitting: Internal Medicine

## 2022-12-27 MED ORDER — SIMVASTATIN 20 MG PO TABS: 20.0000 mg | ORAL_TABLET | Freq: Every day | ORAL | 2 refills | Status: DC

## 2022-12-27 MED ORDER — SIMVASTATIN 20 MG PO TABS: 20.0000 mg | ORAL_TABLET | Freq: Every day | ORAL | 3 refills | Status: DC

## 2022-12-27 NOTE — Addendum Note (Signed)
Addended by: Geradine Girt D on: 12/27/2022 11:08 AM   Modules accepted: Orders

## 2023-01-02 ENCOUNTER — Ambulatory Visit (HOSPITAL_COMMUNITY): Payer: Medicare HMO | Attending: Cardiovascular Disease

## 2023-01-02 DIAGNOSIS — I361 Nonrheumatic tricuspid (valve) insufficiency: Secondary | ICD-10-CM | POA: Diagnosis not present

## 2023-01-02 DIAGNOSIS — I340 Nonrheumatic mitral (valve) insufficiency: Secondary | ICD-10-CM | POA: Diagnosis not present

## 2023-01-02 DIAGNOSIS — I119 Hypertensive heart disease without heart failure: Secondary | ICD-10-CM | POA: Diagnosis not present

## 2023-01-02 DIAGNOSIS — R002 Palpitations: Secondary | ICD-10-CM | POA: Diagnosis not present

## 2023-01-02 DIAGNOSIS — E785 Hyperlipidemia, unspecified: Secondary | ICD-10-CM | POA: Insufficient documentation

## 2023-01-02 LAB — ECHOCARDIOGRAM COMPLETE
Area-P 1/2: 3.08 cm2
S' Lateral: 2.6 cm

## 2023-01-27 ENCOUNTER — Telehealth: Payer: Self-pay | Admitting: Cardiovascular Disease

## 2023-01-27 DIAGNOSIS — I340 Nonrheumatic mitral (valve) insufficiency: Secondary | ICD-10-CM

## 2023-01-27 NOTE — Telephone Encounter (Signed)
Called and spoke with husband Dorinda Hill on [place] who verbalized understanding. Will relay results to patient. Repeat ECHO ordered to be done in 1 year.

## 2023-01-27 NOTE — Telephone Encounter (Signed)
-----   Message from Wendall Stade, MD sent at 01/02/2023  1:40 PM EDT ----- EF normal moderate MR f/u echo in a year

## 2023-02-05 NOTE — Progress Notes (Deleted)
Patient Care Team: Margaree Mackintosh, MD as PCP - General (Internal Medicine) Wendall Stade, MD as PCP - Cardiology (Cardiology)  Visit Date: 02/18/23  Subjective:    Patient ID: [name] , Female   DOB: January 21, 1941, 82 y.o.    MRN: [id number]   82 y.o. Female presents today for a 6 month follow-up. Patient has a past medical history of anxiety, hearing loss both ears, migraine headaches, hyperlipidemia, hypertension.  History of anxiety treated with alprazolam 0.5 mg morning, 1 mg evening.  History of hypertension treated with hydrochlorothiazide 25 mg daily, losartan 50 mg daily. Blood pressure normal at 132/82.  History of hyperlipidemia treated with simvastatin 20 mg daily. LDL slightly elevated at 105 on 02/14/23- lipids otherwise normal. HGBA1c at 5.8% on 02/14/23, up from 5.5% on 01/25/22.  History of hypothyroidism treated with levothyroxine 75 mcg daily. TSH elevated at 5.12 on 02/14/23.   History of arrhythmia/SVT treated with metoprolol tartrate 50 mg daily. Seen by Cardiologist, Dr. Eden Emms. 12/03/22 EKG showed sinus rhythm/sinus arrhythmia rate 85 otherwise normal.   Liver functions normal.  Past Medical History:  Diagnosis Date   Anxiety    Hearing loss of both ears    History of migraines    Hyperlipidemia    Hypertension      Family History  Problem Relation Age of Onset   Cancer Father    Breast cancer Neg Hx     Social History   Social History Narrative   Not on file      Review of Systems  Constitutional:  Negative for fever and malaise/fatigue.  HENT:  Negative for congestion.   Eyes:  Negative for blurred vision.  Respiratory:  Negative for cough and shortness of breath.   Cardiovascular:  Negative for chest pain, palpitations and leg swelling.  Gastrointestinal:  Negative for vomiting.  Musculoskeletal:  Negative for back pain.  Skin:  Negative for rash.  Neurological:  Negative for loss of consciousness and headaches.         Objective:   Vitals: BP 132/82   Pulse 73   Temp 98.1 F (36.7 C) (Temporal)   Resp 16   Ht 5\' 2"  (1.575 m)   Wt 161 lb (73 kg)   SpO2 98%   BMI 29.45 kg/m    Physical Exam Vitals and nursing note reviewed.  Constitutional:      General: She is not in acute distress.    Appearance: Normal appearance. She is not toxic-appearing.  HENT:     Head: Normocephalic and atraumatic.  Neck:     Thyroid: No thyroid mass, thyromegaly or thyroid tenderness.     Vascular: No carotid bruit.  Cardiovascular:     Rate and Rhythm: Normal rate and regular rhythm. No extrasystoles are present.    Pulses: Normal pulses.     Heart sounds: Normal heart sounds. No murmur heard.    No friction rub. No gallop.     Comments: Occasional extrasystole. Pulmonary:     Effort: Pulmonary effort is normal. No respiratory distress.     Breath sounds: Normal breath sounds. No wheezing or rales.  Lymphadenopathy:     Cervical: No cervical adenopathy.  Skin:    General: Skin is warm and dry.  Neurological:     Mental Status: She is alert and oriented to person, place, and time. Mental status is at baseline.  Psychiatric:        Mood and Affect: Mood normal.  Behavior: Behavior normal.        Thought Content: Thought content normal.        Judgment: Judgment normal.       Results:   Studies obtained and personally reviewed by me:   Labs:       Component Value Date/Time   NA 137 08/16/2022 0913   K 4.2 08/16/2022 0913   CL 100 08/16/2022 0913   CO2 29 08/16/2022 0913   GLUCOSE 97 08/16/2022 0913   BUN 23 08/16/2022 0913   CREATININE 0.91 08/16/2022 0913   CALCIUM 10.0 08/16/2022 0913   PROT 6.6 02/14/2023 0949   ALBUMIN 4.3 09/06/2021 1306   AST 16 02/14/2023 0949   ALT 12 02/14/2023 0949   ALKPHOS 57 09/06/2021 1306   BILITOT 0.6 02/14/2023 0949   GFRNONAA >60 09/06/2021 1306   GFRNONAA 52 (L) 07/13/2020 0959   GFRAA 60 07/13/2020 0959     Lab Results  Component Value  Date   WBC 6.2 08/16/2022   HGB 15.0 08/16/2022   HCT 44.5 08/16/2022   MCV 86.9 08/16/2022   PLT 289 08/16/2022    Lab Results  Component Value Date   CHOL 193 02/14/2023   HDL 68 02/14/2023   LDLCALC 105 (H) 02/14/2023   TRIG 108 02/14/2023   CHOLHDL 2.8 02/14/2023    Lab Results  Component Value Date   HGBA1C 5.8 (H) 02/14/2023     Lab Results  Component Value Date   TSH 5.12 (H) 02/14/2023      Assessment & Plan:   Anxiety: treated with alprazolam 0.5 mg morning, 1 mg evening.  Hypertension: treated with hydrochlorothiazide 25 mg daily, losartan 50 mg daily. Blood pressure normal at 132/82.  Hyperlipidemia: treated with simvastatin 20 mg daily. LDL slightly elevated at 105 on 02/14/23- lipids otherwise normal. HGBA1c at 5.8% on 02/14/23, up from 5.5% on 01/25/22.  Hypothyroidism: previously treated with levothyroxine 75 mcg daily. TSH elevated at 5.12 on 02/14/23. Increase levothyroxine to 88 mcg daily. Recheck in 6 weeks with OV and TSH  Arrhythmia/SVT: treated with metoprolol tartrate 50 mg daily. Seen by Cardiologist, Dr. Eden Emms. 12/03/22 EKG showed sinus rhythm/sinus arrhythmia rate 85 otherwise normal.   Return in 6 months for health maintenance exam or as needed.    I,Alexander Ruley,acting as a Neurosurgeon for Margaree Mackintosh, MD.,have documented all relevant documentation on the behalf of Margaree Mackintosh, MD,as directed by  Margaree Mackintosh, MD while in the presence of Margaree Mackintosh, MD.   I, Margaree Mackintosh, MD, have reviewed all documentation for this visit. The documentation on 02/18/23 for the exam, diagnosis, procedures, and orders are all accurate and complete.

## 2023-02-14 ENCOUNTER — Other Ambulatory Visit: Payer: Medicare HMO

## 2023-02-14 DIAGNOSIS — R7309 Other abnormal glucose: Secondary | ICD-10-CM | POA: Diagnosis not present

## 2023-02-14 DIAGNOSIS — Z87898 Personal history of other specified conditions: Secondary | ICD-10-CM | POA: Diagnosis not present

## 2023-02-14 DIAGNOSIS — E063 Autoimmune thyroiditis: Secondary | ICD-10-CM

## 2023-02-14 DIAGNOSIS — E7800 Pure hypercholesterolemia, unspecified: Secondary | ICD-10-CM | POA: Diagnosis not present

## 2023-02-15 LAB — TSH: TSH: 5.12 mIU/L — ABNORMAL HIGH (ref 0.40–4.50)

## 2023-02-15 LAB — HEPATIC FUNCTION PANEL
AG Ratio: 1.8 (calc) (ref 1.0–2.5)
ALT: 12 U/L (ref 6–29)
AST: 16 U/L (ref 10–35)
Albumin: 4.2 g/dL (ref 3.6–5.1)
Alkaline phosphatase (APISO): 60 U/L (ref 37–153)
Bilirubin, Direct: 0.1 mg/dL (ref 0.0–0.2)
Globulin: 2.4 g/dL (calc) (ref 1.9–3.7)
Indirect Bilirubin: 0.5 mg/dL (calc) (ref 0.2–1.2)
Total Bilirubin: 0.6 mg/dL (ref 0.2–1.2)
Total Protein: 6.6 g/dL (ref 6.1–8.1)

## 2023-02-15 LAB — LIPID PANEL
Cholesterol: 193 mg/dL (ref <200)
HDL: 68 mg/dL (ref >=50)
LDL Cholesterol (Calc): 105 mg/dL (calc) — ABNORMAL HIGH
Non-HDL Cholesterol (Calc): 125 mg/dL (calc) (ref <130)
Total CHOL/HDL Ratio: 2.8 (calc) (ref <5.0)
Triglycerides: 108 mg/dL (ref <150)

## 2023-02-15 LAB — HEMOGLOBIN A1C
Hgb A1c MFr Bld: 5.8 % of total Hgb — ABNORMAL HIGH (ref <5.7)
Mean Plasma Glucose: 120 mg/dL
eAG (mmol/L): 6.6 mmol/L

## 2023-02-18 ENCOUNTER — Ambulatory Visit (INDEPENDENT_AMBULATORY_CARE_PROVIDER_SITE_OTHER): Payer: Medicare HMO | Admitting: Internal Medicine

## 2023-02-18 ENCOUNTER — Encounter: Payer: Self-pay | Admitting: Internal Medicine

## 2023-02-18 VITALS — BP 132/82 | HR 73 | Temp 98.1°F | Resp 16 | Ht 62.0 in | Wt 161.0 lb

## 2023-02-18 DIAGNOSIS — I10 Essential (primary) hypertension: Secondary | ICD-10-CM | POA: Diagnosis not present

## 2023-02-18 DIAGNOSIS — I499 Cardiac arrhythmia, unspecified: Secondary | ICD-10-CM

## 2023-02-18 DIAGNOSIS — F411 Generalized anxiety disorder: Secondary | ICD-10-CM | POA: Diagnosis not present

## 2023-02-18 DIAGNOSIS — E782 Mixed hyperlipidemia: Secondary | ICD-10-CM

## 2023-02-18 DIAGNOSIS — E039 Hypothyroidism, unspecified: Secondary | ICD-10-CM | POA: Diagnosis not present

## 2023-02-18 MED ORDER — LEVOTHYROXINE SODIUM 88 MCG PO TABS
88.0000 ug | ORAL_TABLET | Freq: Every day | ORAL | 1 refills | Status: DC
Start: 2023-02-18 — End: 2023-08-14

## 2023-02-18 NOTE — Patient Instructions (Addendum)
Labs are stable but would like to see TSH around 1.00.  Increase Levothyroxine to 88 mcg daily and follow up in 6 weeks. BP is stable on current regimen. Lipids are stable on simvastatin. It was a pleassure to see you today. RTC in 6 weeks for TSH and OV.

## 2023-02-18 NOTE — Progress Notes (Signed)
Expand All Collapse All      Patient Care Team: Armon Orvis, Luanna Cole, MD as PCP - General (Internal Medicine) Wendall Stade, MD as PCP - Cardiology (Cardiology)   Visit Date: 02/18/23   Subjective:     Patient ID: [name] , Female   DOB: [birth year], 82 y.o.    MRN: [id number]    82 y.o. Female presents today for a 6 month follow-up. Patient has a past medical history of anxiety, hearing loss both ears, migraine headaches, hyperlipidemia, hypertension.   History of anxiety treated with alprazolam 0.5 mg morning, 1 mg evening.   History of hypertension treated with hydrochlorothiazide 25 mg daily, losartan 50 mg daily. Blood pressure normal at 132/82.   History of hyperlipidemia treated with simvastatin 20 mg daily. LDL slightly elevated at 105 on 02/14/23- lipids otherwise normal. HGBA1c at 5.8% on 02/14/23, up from 5.5% on 01/25/22.   History of hypothyroidism treated with levothyroxine 75 mcg daily. TSH elevated at 5.12 on 02/14/23.    History of arrhythmia/SVT treated with metoprolol tartrate 50 mg daily. Seen by Cardiologist, Dr. Eden Emms. 12/03/22 EKG showed sinus rhythm/sinus arrhythmia rate 85 otherwise normal.    Liver functions normal.       Past Medical History:  Diagnosis Date   Anxiety     Hearing loss of both ears     History of migraines     Hyperlipidemia     Hypertension             Family History  Problem Relation Age of Onset   Cancer Father     Breast cancer Neg Hx        Social History       Social History Narrative   Not on file          Review of Systems  Constitutional:  Negative for fever and malaise/fatigue.  HENT:  Negative for congestion.   Eyes:  Negative for blurred vision.  Respiratory:  Negative for cough and shortness of breath.   Cardiovascular:  Negative for chest pain, palpitations and leg swelling.  Gastrointestinal:  Negative for vomiting.  Musculoskeletal:  Negative for back pain.  Skin:  Negative for rash.  Neurological:   Negative for loss of consciousness and headaches.           Objective:    Vitals: BP 132/82   Pulse 73   Temp 98.1 F (36.7 C) (Temporal)   Resp 16   Ht 5\' 2"  (1.575 m)   Wt 161 lb (73 kg)   SpO2 98%   BMI 29.45 kg/m      Physical Exam Vitals and nursing note reviewed.  Constitutional:      General: She is not in acute distress.    Appearance: Normal appearance. She is not toxic-appearing.  HENT:     Head: Normocephalic and atraumatic.  Neck:     Thyroid: No thyroid mass, thyromegaly or thyroid tenderness.     Vascular: No carotid bruit.  Cardiovascular:     Rate and Rhythm: Normal rate and regular rhythm. No extrasystoles are present.    Pulses: Normal pulses.     Heart sounds: Normal heart sounds. No murmur heard.    No friction rub. No gallop.     Comments: Occasional extrasystole. Pulmonary:     Effort: Pulmonary effort is normal. No respiratory distress.     Breath sounds: Normal breath sounds. No wheezing or rales.  Lymphadenopathy:     Cervical: No cervical adenopathy.  Skin:    General: Skin is warm and dry.  Neurological:     Mental Status: She is alert and oriented to person, place, and time. Mental status is at baseline.  Psychiatric:        Mood and Affect: Mood normal.        Behavior: Behavior normal.        Thought Content: Thought content normal.        Judgment: Judgment normal.          Results:    Studies obtained and personally reviewed by me:     Labs:               Labs (Brief)          Component Value Date/Time    NA 137 08/16/2022 0913    K 4.2 08/16/2022 0913    CL 100 08/16/2022 0913    CO2 29 08/16/2022 0913    GLUCOSE 97 08/16/2022 0913    BUN 23 08/16/2022 0913    CREATININE 0.91 08/16/2022 0913    CALCIUM 10.0 08/16/2022 0913    PROT 6.6 02/14/2023 0949    ALBUMIN 4.3 09/06/2021 1306    AST 16 02/14/2023 0949    ALT 12 02/14/2023 0949    ALKPHOS 57 09/06/2021 1306    BILITOT 0.6 02/14/2023 0949    GFRNONAA >60  09/06/2021 1306    GFRNONAA 52 (L) 07/13/2020 0959    GFRAA 60 07/13/2020 0959        Recent Labs       Lab Results  Component Value Date    WBC 6.2 08/16/2022    HGB 15.0 08/16/2022    HCT 44.5 08/16/2022    MCV 86.9 08/16/2022    PLT 289 08/16/2022        Recent Labs       Lab Results  Component Value Date    CHOL 193 02/14/2023    HDL 68 02/14/2023    LDLCALC 105 (H) 02/14/2023    TRIG 108 02/14/2023    CHOLHDL 2.8 02/14/2023        Recent Labs       Lab Results  Component Value Date    HGBA1C 5.8 (H) 02/14/2023        Recent Labs       Lab Results  Component Value Date    TSH 5.12 (H) 02/14/2023         Assessment & Plan:    Anxiety: treated with alprazolam 0.5 mg morning, 1 mg evening.   Hypertension: treated with hydrochlorothiazide 25 mg daily, losartan 50 mg daily. Blood pressure normal at 132/82.   Hyperlipidemia: treated with simvastatin 20 mg daily. LDL slightly elevated at 105 on 02/14/23- lipids otherwise normal. HGBA1c at 5.8% on 02/14/23, up from 5.5% on 01/25/22.   Hypothyroidism: previously treated with levothyroxine 75 mcg daily. TSH elevated at 5.12 on 02/14/23. Increase levothyroxine to 88 mcg daily. Recheck in 6 weeks with OV and TSH   Arrhythmia/SVT: treated with metoprolol tartrate 50 mg daily. Seen by Cardiologist, Dr. Eden Emms. 12/03/22 EKG showed sinus rhythm/sinus arrhythmia rate 85 otherwise normal.    Return in 6 months for health maintenance exam or as needed.       I,Alexander Ruley,acting as a Neurosurgeon for Margaree Mackintosh, MD.,have documented all relevant documentation on the behalf of Margaree Mackintosh, MD,as directed by  Margaree Mackintosh, MD while in the presence of Margaree Mackintosh, MD.  IMargaree Mackintosh, MD, have reviewed all documentation for this visit. The documentation on 02/18/23 for the exam, diagnosis, procedures, and orders are all accurate and complete.       Revision History

## 2023-02-20 ENCOUNTER — Other Ambulatory Visit: Payer: Self-pay | Admitting: Internal Medicine

## 2023-03-01 DIAGNOSIS — Z0100 Encounter for examination of eyes and vision without abnormal findings: Secondary | ICD-10-CM | POA: Diagnosis not present

## 2023-03-28 NOTE — Progress Notes (Addendum)
Patient Care Team: Margaree Mackintosh, MD as PCP - General (Internal Medicine) Wendall Stade, MD as PCP - Cardiology (Cardiology)  Visit Date: 04/04/23  Subjective:    Patient ID: [name] , Female   DOB: [birth year], 82 y.o.    MRN: [id number]   82 y.o. Female presents today for 6 week TSH check. History of hypothyroidism treated with levothyroxine 88 mcg daily. TSH at 0.23 on 04/03/23, down from 5.12 on 02/14/23. She is feeling no different after being on this new dose.  Has been having lower back pain for the past several days. Has not been doing any strenuous activity. Taking Tylenol without relief.  Past Medical History:  Diagnosis Date   Anxiety    Hearing loss of both ears    History of migraines    Hyperlipidemia    Hypertension      Family History  Problem Relation Age of Onset   Cancer Father    Breast cancer Neg Hx     Social History   Social History Narrative   Not on file      Review of Systems  Constitutional:  Negative for fever and malaise/fatigue.  HENT:  Negative for congestion.   Eyes:  Negative for blurred vision.  Respiratory:  Negative for cough and shortness of breath.   Cardiovascular:  Negative for chest pain, palpitations and leg swelling.  Gastrointestinal:  Negative for vomiting.  Musculoskeletal:  Positive for back pain.  Skin:  Negative for rash.  Neurological:  Negative for loss of consciousness and headaches.        Objective:   Vitals: BP 118/78   Pulse 60   Resp 16   Ht 5\' 2"  (1.575 m)   Wt 161 lb (73 kg)   SpO2 97%   BMI 29.45 kg/m    Physical Exam Vitals and nursing note reviewed.  Constitutional:      General: She is not in acute distress.    Appearance: Normal appearance. She is not toxic-appearing.  HENT:     Head: Normocephalic and atraumatic.  Pulmonary:     Effort: Pulmonary effort is normal.  Musculoskeletal:     Comments: Right and left straight leg raise tests negative.  Skin:    General: Skin  is warm and dry.  Neurological:     Mental Status: She is alert and oriented to person, place, and time. Mental status is at baseline.  Psychiatric:        Mood and Affect: Mood normal.        Behavior: Behavior normal.        Thought Content: Thought content normal.        Judgment: Judgment normal.       Results:   Studies obtained and personally reviewed by me:   Labs:       Component Value Date/Time   NA 137 08/16/2022 0913   K 4.2 08/16/2022 0913   CL 100 08/16/2022 0913   CO2 29 08/16/2022 0913   GLUCOSE 97 08/16/2022 0913   BUN 23 08/16/2022 0913   CREATININE 0.91 08/16/2022 0913   CALCIUM 10.0 08/16/2022 0913   PROT 6.6 02/14/2023 0949   ALBUMIN 4.3 09/06/2021 1306   AST 16 02/14/2023 0949   ALT 12 02/14/2023 0949   ALKPHOS 57 09/06/2021 1306   BILITOT 0.6 02/14/2023 0949   GFRNONAA >60 09/06/2021 1306   GFRNONAA 52 (L) 07/13/2020 0959   GFRAA 60 07/13/2020 0959     Lab  Results  Component Value Date   WBC 6.2 08/16/2022   HGB 15.0 08/16/2022   HCT 44.5 08/16/2022   MCV 86.9 08/16/2022   PLT 289 08/16/2022    Lab Results  Component Value Date   CHOL 193 02/14/2023   HDL 68 02/14/2023   LDLCALC 105 (H) 02/14/2023   TRIG 108 02/14/2023   CHOLHDL 2.8 02/14/2023    Lab Results  Component Value Date   HGBA1C 5.8 (H) 02/14/2023     Lab Results  Component Value Date   TSH 0.23 (L) 04/03/2023      Assessment & Plan:   Hypothyroidism: treated with levothyroxine 88 mcg daily. TSH at 0.23 on 04/03/23, down from 5.12 on 02/14/23. TSH sas elevated at last visit and is now normal.  Back pain: prescribed Meloxicam 15 mg daily with supper, Flexeril 10 mg at bedtime. Can order PT if needed.  Vaccine counseling: discussed RSV, Shingles vaccines.  Medicare wellness and health maintenance exam scheduled for November  I, Mary J Baxley, MD, have reviewed all documentation for this visit. The documentation on 04/04/23 for the exam, diagnosis, procedures,  and orders are all accurate and complete.  IMargaree Mackintosh, MD, have reviewed all documentation for this visit. The documentation on 04/04/23 for the exam, diagnosis, procedures, and orders are all accurate and complete.  IMargaree Mackintosh, MD, have reviewed all documentation for this visit. The documentation on 04/04/23 for the exam, diagnosis, procedures, and orders are all accurate and complete.

## 2023-04-03 ENCOUNTER — Other Ambulatory Visit: Payer: Medicare HMO

## 2023-04-03 DIAGNOSIS — E039 Hypothyroidism, unspecified: Secondary | ICD-10-CM

## 2023-04-04 ENCOUNTER — Ambulatory Visit (INDEPENDENT_AMBULATORY_CARE_PROVIDER_SITE_OTHER): Payer: Medicare HMO | Admitting: Internal Medicine

## 2023-04-04 ENCOUNTER — Encounter: Payer: Self-pay | Admitting: Internal Medicine

## 2023-04-04 VITALS — BP 118/78 | HR 60 | Resp 16 | Ht 62.0 in | Wt 161.0 lb

## 2023-04-04 DIAGNOSIS — R7989 Other specified abnormal findings of blood chemistry: Secondary | ICD-10-CM | POA: Diagnosis not present

## 2023-04-04 DIAGNOSIS — I10 Essential (primary) hypertension: Secondary | ICD-10-CM | POA: Diagnosis not present

## 2023-04-04 DIAGNOSIS — E039 Hypothyroidism, unspecified: Secondary | ICD-10-CM

## 2023-04-04 DIAGNOSIS — M545 Low back pain, unspecified: Secondary | ICD-10-CM

## 2023-04-04 LAB — TSH: TSH: 0.23 mIU/L — ABNORMAL LOW (ref 0.40–4.50)

## 2023-04-04 MED ORDER — MELOXICAM 15 MG PO TABS
15.0000 mg | ORAL_TABLET | Freq: Every day | ORAL | 0 refills | Status: AC
Start: 2023-04-04 — End: ?

## 2023-04-04 MED ORDER — CYCLOBENZAPRINE HCL 10 MG PO TABS: 10.0000 mg | ORAL_TABLET | Freq: Three times a day (TID) | ORAL | 0 refills | Status: DC | PRN

## 2023-04-04 NOTE — Patient Instructions (Addendum)
Take Meloxicam sparingly with a meal for back pain. Muscle relaxant ordered for bedtime as needed. If not improving, we can order PT. Shingrix and RSV vaccines discussed.

## 2023-05-04 ENCOUNTER — Other Ambulatory Visit: Payer: Self-pay | Admitting: Internal Medicine

## 2023-06-26 ENCOUNTER — Other Ambulatory Visit: Payer: Self-pay | Admitting: Internal Medicine

## 2023-08-14 ENCOUNTER — Other Ambulatory Visit: Payer: Self-pay | Admitting: Internal Medicine

## 2023-08-19 NOTE — Progress Notes (Addendum)
Annual Wellness Visit    Patient Care Team: Baxley, Luanna Cole, MD as PCP - General (Internal Medicine) Wendall Stade, MD as PCP - Cardiology (Cardiology)  Visit Date: 08/26/23   Chief Complaint  Patient presents with   Medicare Wellness   Annual Exam    Subjective:   Patient: [name], Female    DOB: 01/07/1941, 82 y.o.   MRN: [id number]  [name] is a 82 y.o. Female who presents today for her Annual Wellness Visit. History of anxiety, hearing loss, migraine headaches, hyperlipidemia, hypertension.  History of anxiety treated with alprazolam 0.5 mg morning and 1 mg at bedtime.  Seen by Dr. Eden Emms on 12/03/22. 12/03/22 EKG showed benign short lived arrhythmia. 01/02/23 echo showed left atrial size moderately dilated, mitral valve degenerative, moderate mitral valve regurgitation, aortic valve sclerosis/calcification is present.  History of hypertension treated with losartan 50 mg daily, metoprolol tartrate 50 mg daily, hydrochlorothiazide 25 mg daily. Blood pressure normal today at 130/80.  History of hyperlipidemia treated with simvastatin 20 mg daily. Lipid panel normal.  History of hypothyroidism treated with levothyroxine 88 mcg daily. TSH low at 0.17.  History of left carotid bruit.  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  Has had no neurological symptoms.  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   She underwent hysterectomy in 2002 with bilateral oophorectomy.  History of ophthalmic migraine 1993.  History of pneumonia 2003.  Was seen in the emergency department December 2022 with bright red blood per rectum. Stools were formed and not bloody. She has history of irritable bowel syndrome. Has not wanted to have colonoscopy. Occult blood testing in the emergency department was negative. No further issues. Did see Mike Gip,  [place] January 2023.   09/09/22 mammogram showed possible asymmetry left breast, no finding suspicious for malignancy in right breast. Follow-up left mammogram showed small benign cysts upper left breast, largest measuring 7 mm. Wishes to postpone mammogram until 2025.  Has declined colonoscopy in the past. Agreeable to Hemoccult testing.  08/21/23 labs reviewed today. Glucose elevated at 102. Kidney, liver functions normal. Blood proteins normal. RBC elevated at 5.33 million. HCT elevated at 46.6. HGBA1c at 5.6% on 08/21/23, down from 5.8% on 02/14/23.   Social history: She is married.  Completed high school.  She is retired.  Does not smoke or consume alcohol.   Family history: Father died of cancer at age 30.  He had history of MI.  Mother with history of hypertension.  Sister with history of hypertension.  Past Medical History:  Diagnosis Date   Anxiety    Hearing loss of both ears    History of migraines    Hyperlipidemia    Hypertension      Family History  Problem Relation Age of Onset   Cancer Father    Breast cancer Neg Hx      Social History   Social History Narrative   Not on file     Review of Systems  Constitutional:  Negative for chills, fever, malaise/fatigue and weight loss.  HENT:  Negative for hearing loss, sinus pain and sore throat.   Respiratory:  Negative for cough, hemoptysis and shortness of breath.   Cardiovascular:  Negative for chest pain, palpitations, leg swelling and PND.  Gastrointestinal:  Negative for abdominal pain, constipation, diarrhea, heartburn, nausea and vomiting.  Genitourinary:  Negative for dysuria, frequency and urgency.  Musculoskeletal:  Negative for back pain, myalgias and neck pain.  Skin:  Negative for itching and rash.  Neurological:  Negative for dizziness, tingling, seizures and headaches.  Endo/Heme/Allergies:  Negative for polydipsia.  Psychiatric/Behavioral:  Negative for depression. The patient is not nervous/anxious.        Objective:   Vitals: BP 130/80   Pulse 80   Ht 5\' 2"  (1.575 m)   Wt 158 lb (71.7 kg)   SpO2 97%   BMI 28.90 kg/m   Physical Exam Vitals and nursing note reviewed.  Constitutional:      General: She is not in acute distress.    Appearance: Normal appearance. She is not ill-appearing or toxic-appearing.  HENT:     Head: Normocephalic and atraumatic.     Right Ear: Hearing, tympanic membrane, ear canal and external ear normal.     Left Ear: Hearing, tympanic membrane, ear canal and external ear normal.     Mouth/Throat:     Pharynx: Oropharynx is clear.  Eyes:     Extraocular Movements: Extraocular movements intact.     Pupils: Pupils are equal, round, and reactive to light.  Neck:     Thyroid: No thyroid mass, thyromegaly or thyroid tenderness.     Vascular: No carotid bruit.  Cardiovascular:     Rate and Rhythm: Normal rate and regular rhythm. No extrasystoles are present.    Pulses:          Dorsalis pedis pulses are 1+ on the right side and 1+ on the left side.     Heart sounds: Normal heart sounds. No murmur heard.    No friction rub. No gallop.     Comments: Occasional irregular contraction. Pulmonary:     Effort: Pulmonary effort is normal.     Breath sounds: Normal breath sounds. No decreased breath sounds, wheezing, rhonchi or rales.  Chest:     Chest wall: No mass.  Breasts:    Right: No mass.     Left: No mass.  Abdominal:     Palpations: Abdomen is soft. There is no hepatomegaly, splenomegaly or mass.     Tenderness: There is no abdominal tenderness.     Hernia: No hernia is present.  Musculoskeletal:     Cervical back: Normal range of motion.     Right lower leg: No edema.     Left lower leg: No edema.  Lymphadenopathy:     Cervical: No cervical adenopathy.     Upper Body:     Right upper body: No supraclavicular adenopathy.     Left upper body: No supraclavicular adenopathy.  Skin:    General: Skin is warm and dry.  Neurological:     General:  No focal deficit present.     Mental Status: She is alert and oriented to person, place, and time. Mental status is at baseline.     Sensory: Sensation is intact.     Motor: Motor function is intact. No weakness.     Deep Tendon Reflexes: Reflexes are normal and symmetric.  Psychiatric:        Attention and Perception: Attention normal.        Mood and Affect: Mood normal.        Speech: Speech normal.        Behavior: Behavior normal.        Thought Content: Thought content normal.        Cognition and Memory: Cognition  normal.        Judgment: Judgment normal.      Most recent functional status assessment:    08/26/2023    3:05 PM  In your present state of health, do you have any difficulty performing the following activities:  Hearing? 1  Vision? 0  Difficulty concentrating or making decisions? 0  Walking or climbing stairs? 0  Dressing or bathing? 0  Doing errands, shopping? 0  Preparing Food and eating ? N  Using the Toilet? N  In the past six months, have you accidently leaked urine? N  Do you have problems with loss of bowel control? N  Managing your Medications? N  Managing your Finances? N  Housekeeping or managing your Housekeeping? N   Most recent fall risk assessment:    08/26/2023    3:06 PM  Fall Risk   Falls in the past year? 1  Number falls in past yr: 0  Injury with Fall? 0  Risk for fall due to : Other (Comment)  Follow up Falls evaluation completed;Education provided;Falls prevention discussed    Most recent depression screenings:    10/14/2022   10:38 AM 08/20/2022   10:06 AM  PHQ 2/9 Scores  PHQ - 2 Score 0 0   Most recent cognitive screening:    08/26/2023    3:08 PM  6CIT Screen  What Year? 0 points  What month? 0 points  What time? 0 points  Count back from 20 0 points  Months in reverse 0 points  Repeat phrase 0 points  Total Score 0 points     Results:   Studies obtained and personally reviewed by me:  09/09/22 mammogram  showed possible asymmetry left breast, no finding suspicious for malignancy in right breast. Follow-up left mammogram showed small benign cysts upper left breast, largest measuring 7 mm. Wishes to postpone mammogram until 2025.  Labs:       Component Value Date/Time   NA 137 08/21/2023 0927   K 4.6 08/21/2023 0927   CL 100 08/21/2023 0927   CO2 29 08/21/2023 0927   GLUCOSE 102 (H) 08/21/2023 0927   BUN 21 08/21/2023 0927   CREATININE 0.94 08/21/2023 0927   CALCIUM 10.2 08/21/2023 0927   PROT 6.7 08/21/2023 0927   ALBUMIN 4.3 09/06/2021 1306   AST 15 08/21/2023 0927   ALT 10 08/21/2023 0927   ALKPHOS 57 09/06/2021 1306   BILITOT 0.6 08/21/2023 0927   GFRNONAA >60 09/06/2021 1306   GFRNONAA 52 (L) 07/13/2020 0959   GFRAA 60 07/13/2020 0959     Lab Results  Component Value Date   WBC 5.4 08/21/2023   HGB 15.1 08/21/2023   HCT 46.6 (H) 08/21/2023   MCV 87.4 08/21/2023   PLT 271 08/21/2023    Lab Results  Component Value Date   CHOL 187 08/21/2023   HDL 70 08/21/2023   LDLCALC 97 08/21/2023   TRIG 104 08/21/2023   CHOLHDL 2.7 08/21/2023    Lab Results  Component Value Date   HGBA1C 5.6 08/21/2023     Lab Results  Component Value Date   TSH 0.17 (L) 08/21/2023    Assessment & Plan:   Anxiety: stable with alprazolam 0.5 mg morning and 1 mg at bedtime.  Hypertension: treated with losartan 50 mg daily, metoprolol tartrate 50 mg daily, hydrochlorothiazide 25 mg daily. Blood pressure normal today at 130/80.  Hyperlipidemia: treated with simvastatin 20 mg daily. Lipid panel normal.  Hypothyroidism: decrease levothyroxine to 75 mcg daily.  Pelvic exam deferred due to age.  09/09/22 mammogram showed possible asymmetry left breast, no finding suspicious for malignancy in right breast. Follow-up left mammogram showed small benign cysts upper left breast, largest measuring 7 mm. Wishes to postpone mammogram until 2025.  Given Hemoccult cards, which she is to  return.  Vaccine counseling: UTD on pneumococcal 20 vaccine. Suggested Covid-19 vaccine.  Return in 6 weeks for TSH recheck.     Annual wellness visit done today including the all of the following: Reviewed patient's Family Medical History Reviewed and updated list of patient's medical providers Assessment of cognitive impairment was done Assessed patient's functional ability Established a written schedule for health screening services Health Risk Assessent Completed and Reviewed  Discussed health benefits of physical activity, and encouraged her to engage in regular exercise appropriate for her age and condition.        I,Alexander Ruley,acting as a Neurosurgeon for Margaree Mackintosh, MD.,have documented all relevant documentation on the behalf of Margaree Mackintosh, MD,as directed by  Margaree Mackintosh, MD while in the presence of Margaree Mackintosh, MD. I, Margaree Mackintosh, MD, have reviewed all documentation for this visit. The documentation on 09/05/23 for the exam, diagnosis, procedures, and orders are all accurate and complete.  IMargaree Mackintosh, MD, have reviewed all documentation for this visit. The documentation on 09/05/23 for the exam, diagnosis, procedures, and orders are all accurate and complete.

## 2023-08-21 ENCOUNTER — Other Ambulatory Visit: Payer: Medicare HMO

## 2023-08-21 DIAGNOSIS — E039 Hypothyroidism, unspecified: Secondary | ICD-10-CM | POA: Diagnosis not present

## 2023-08-21 DIAGNOSIS — F411 Generalized anxiety disorder: Secondary | ICD-10-CM

## 2023-08-21 DIAGNOSIS — E782 Mixed hyperlipidemia: Secondary | ICD-10-CM | POA: Diagnosis not present

## 2023-08-21 DIAGNOSIS — Z0000 Encounter for general adult medical examination without abnormal findings: Secondary | ICD-10-CM | POA: Diagnosis not present

## 2023-08-21 DIAGNOSIS — E063 Autoimmune thyroiditis: Secondary | ICD-10-CM

## 2023-08-21 DIAGNOSIS — R7302 Impaired glucose tolerance (oral): Secondary | ICD-10-CM

## 2023-08-21 DIAGNOSIS — I10 Essential (primary) hypertension: Secondary | ICD-10-CM | POA: Diagnosis not present

## 2023-08-22 LAB — COMPLETE METABOLIC PANEL WITH GFR
AG Ratio: 2 (calc) (ref 1.0–2.5)
ALT: 10 U/L (ref 6–29)
AST: 15 U/L (ref 10–35)
Albumin: 4.5 g/dL (ref 3.6–5.1)
Alkaline phosphatase (APISO): 61 U/L (ref 37–153)
BUN: 21 mg/dL (ref 7–25)
CO2: 29 mmol/L (ref 20–32)
Calcium: 10.2 mg/dL (ref 8.6–10.4)
Chloride: 100 mmol/L (ref 98–110)
Creat: 0.94 mg/dL (ref 0.60–0.95)
Globulin: 2.2 g/dL (ref 1.9–3.7)
Glucose, Bld: 102 mg/dL — ABNORMAL HIGH (ref 65–99)
Potassium: 4.6 mmol/L (ref 3.5–5.3)
Sodium: 137 mmol/L (ref 135–146)
Total Bilirubin: 0.6 mg/dL (ref 0.2–1.2)
Total Protein: 6.7 g/dL (ref 6.1–8.1)
eGFR: 60 mL/min/{1.73_m2} (ref >=60)

## 2023-08-22 LAB — CBC WITH DIFFERENTIAL/PLATELET
Absolute Lymphocytes: 1885 {cells}/uL (ref 850–3900)
Absolute Monocytes: 572 {cells}/uL (ref 200–950)
Basophils Absolute: 49 {cells}/uL (ref 0–200)
Basophils Relative: 0.9 %
Eosinophils Absolute: 108 {cells}/uL (ref 15–500)
Eosinophils Relative: 2 %
HCT: 46.6 % — ABNORMAL HIGH (ref 35.0–45.0)
Hemoglobin: 15.1 g/dL (ref 11.7–15.5)
MCH: 28.3 pg (ref 27.0–33.0)
MCHC: 32.4 g/dL (ref 32.0–36.0)
MCV: 87.4 fL (ref 80.0–100.0)
MPV: 10.9 fL (ref 7.5–12.5)
Monocytes Relative: 10.6 %
Neutro Abs: 2786 {cells}/uL (ref 1500–7800)
Neutrophils Relative %: 51.6 %
Platelets: 271 10*3/uL (ref 140–400)
RBC: 5.33 10*6/uL — ABNORMAL HIGH (ref 3.80–5.10)
RDW: 13.1 % (ref 11.0–15.0)
Total Lymphocyte: 34.9 %
WBC: 5.4 10*3/uL (ref 3.8–10.8)

## 2023-08-22 LAB — HEMOGLOBIN A1C
Hgb A1c MFr Bld: 5.6 %{Hb} (ref <5.7)
Mean Plasma Glucose: 114 mg/dL
eAG (mmol/L): 6.3 mmol/L

## 2023-08-22 LAB — LIPID PANEL
Cholesterol: 187 mg/dL (ref <200)
HDL: 70 mg/dL (ref >=50)
LDL Cholesterol (Calc): 97 mg/dL
Non-HDL Cholesterol (Calc): 117 mg/dL (ref <130)
Total CHOL/HDL Ratio: 2.7 (calc) (ref <5.0)
Triglycerides: 104 mg/dL (ref <150)

## 2023-08-22 LAB — TSH: TSH: 0.17 m[IU]/L — ABNORMAL LOW (ref 0.40–4.50)

## 2023-08-26 ENCOUNTER — Encounter: Payer: Self-pay | Admitting: Internal Medicine

## 2023-08-26 ENCOUNTER — Ambulatory Visit: Payer: Medicare HMO | Admitting: Internal Medicine

## 2023-08-26 VITALS — BP 130/80 | HR 80 | Ht 62.0 in | Wt 158.0 lb

## 2023-08-26 DIAGNOSIS — Z0000 Encounter for general adult medical examination without abnormal findings: Secondary | ICD-10-CM | POA: Diagnosis not present

## 2023-08-26 DIAGNOSIS — H903 Sensorineural hearing loss, bilateral: Secondary | ICD-10-CM

## 2023-08-26 DIAGNOSIS — E782 Mixed hyperlipidemia: Secondary | ICD-10-CM | POA: Diagnosis not present

## 2023-08-26 DIAGNOSIS — Z6828 Body mass index (BMI) 28.0-28.9, adult: Secondary | ICD-10-CM

## 2023-08-26 DIAGNOSIS — E039 Hypothyroidism, unspecified: Secondary | ICD-10-CM

## 2023-08-26 DIAGNOSIS — K580 Irritable bowel syndrome with diarrhea: Secondary | ICD-10-CM | POA: Diagnosis not present

## 2023-08-26 DIAGNOSIS — F419 Anxiety disorder, unspecified: Secondary | ICD-10-CM

## 2023-08-26 DIAGNOSIS — I10 Essential (primary) hypertension: Secondary | ICD-10-CM | POA: Diagnosis not present

## 2023-08-26 DIAGNOSIS — I499 Cardiac arrhythmia, unspecified: Secondary | ICD-10-CM

## 2023-08-26 LAB — POCT URINALYSIS DIP (CLINITEK)
Bilirubin, UA: NEGATIVE
Blood, UA: NEGATIVE
Glucose, UA: NEGATIVE mg/dL
Ketones, POC UA: NEGATIVE mg/dL
Leukocytes, UA: NEGATIVE
Nitrite, UA: NEGATIVE
POC PROTEIN,UA: NEGATIVE
Spec Grav, UA: 1.015 (ref 1.010–1.025)
Urobilinogen, UA: 0.2 U/dL
pH, UA: 6 (ref 5.0–8.0)

## 2023-08-26 MED ORDER — LEVOTHYROXINE SODIUM 75 MCG PO TABS: 75.0000 ug | ORAL_TABLET | Freq: Every day | ORAL | 3 refills | Status: DC

## 2023-09-05 NOTE — Patient Instructions (Signed)
It was a pleasure to see you today.  We are changing dose of thyroid replacement medication to 75 mcg daily.  Return in 6 weeks for follow-up TSH.  Continue simvastatin and losartan as well as metoprolol and HCTZ.  Continue to take alprazolam twice daily as directed.  Up-to-date on pneumococcal vaccine.  Suggest COVID booster.  Return in 6 weeks for TSH recheck since we have adjusted dose of thyroid medication.

## 2023-10-07 DIAGNOSIS — R69 Illness, unspecified: Secondary | ICD-10-CM | POA: Diagnosis not present

## 2023-10-09 ENCOUNTER — Other Ambulatory Visit: Payer: Medicare Other

## 2023-10-09 DIAGNOSIS — E039 Hypothyroidism, unspecified: Secondary | ICD-10-CM

## 2023-10-10 ENCOUNTER — Ambulatory Visit (INDEPENDENT_AMBULATORY_CARE_PROVIDER_SITE_OTHER): Payer: Medicare Other | Admitting: Internal Medicine

## 2023-10-10 ENCOUNTER — Ambulatory Visit: Payer: Medicare HMO | Admitting: Internal Medicine

## 2023-10-10 ENCOUNTER — Encounter: Payer: Self-pay | Admitting: Internal Medicine

## 2023-10-10 VITALS — BP 110/80 | HR 78 | Ht 62.0 in | Wt 156.0 lb

## 2023-10-10 DIAGNOSIS — E063 Autoimmune thyroiditis: Secondary | ICD-10-CM

## 2023-10-10 DIAGNOSIS — E039 Hypothyroidism, unspecified: Secondary | ICD-10-CM | POA: Diagnosis not present

## 2023-10-10 DIAGNOSIS — I10 Essential (primary) hypertension: Secondary | ICD-10-CM | POA: Diagnosis not present

## 2023-10-10 DIAGNOSIS — E782 Mixed hyperlipidemia: Secondary | ICD-10-CM | POA: Diagnosis not present

## 2023-10-10 DIAGNOSIS — H903 Sensorineural hearing loss, bilateral: Secondary | ICD-10-CM | POA: Diagnosis not present

## 2023-10-10 DIAGNOSIS — I471 Supraventricular tachycardia, unspecified: Secondary | ICD-10-CM

## 2023-10-10 DIAGNOSIS — F419 Anxiety disorder, unspecified: Secondary | ICD-10-CM

## 2023-10-10 LAB — TSH: TSH: 0.52 m[IU]/L (ref 0.40–4.50)

## 2023-10-10 NOTE — Progress Notes (Addendum)
 Patient Care Team: [name] PARAS, MD as PCP - General (Internal Medicine) [name] Maude BROCKS, MD as PCP - Cardiology (Cardiology)  Visit Date: 10/10/23  Subjective:   Chief Complaint  Patient presents with   Medical Management of Chronic Issues   Hypothyroidism   Patient [name],Female DOB:07-31-1941,83 y.o.FMW:[id number]   83 y.o. Female presents today for follow-up for Hypothyroidism. Was taking 88 mcg Levothyroxine  until 08/26/23 when dosage was decreased to 75 mcg. TSH 10/09/23 was 0.52, improved from 0.17 on 08/21/23. Reports that she is tolerating this dose.   Her Hypertension is managed with 25 mg Hydrochlorothiazide  daily. Her blood pressure today is 110/80.   She brought attention to a minor wart-like growth near her navel for evaluation. No concerns.   Past Medical History:  Diagnosis Date   Anxiety    Hearing loss of both ears    History of migraines    Hyperlipidemia    Hypertension     Family History  Problem Relation Age of Onset   Cancer Father    Breast cancer Neg Hx    Social Hx: Homemaker. Husband retired from [employer]. Does not smoke or consume alcohol.  Review of Systems  Constitutional:  Negative for fever and malaise/fatigue.  HENT:  Negative for congestion.   Eyes:  Negative for blurred vision.  Respiratory:  Negative for cough and shortness of breath.   Cardiovascular:  Negative for chest pain, palpitations and leg swelling.  Gastrointestinal:  Negative for vomiting.  Musculoskeletal:  Negative for back pain.  Skin:  Negative for rash.  Neurological:  Negative for loss of consciousness and headaches.     Objective:  Vitals: BP 110/80   Pulse 78   Ht 5' 2 (1.575 m)   Wt 156 lb (70.8 kg)   SpO2 96%   BMI 28.53 kg/m  Physical Exam Vitals and nursing note reviewed.  Constitutional:      General: She is not in acute distress.    Appearance: Normal appearance. She is not toxic-appearing.  HENT:     Head: Normocephalic  and atraumatic.  Neck:     Thyroid : No thyroid  mass, thyromegaly or thyroid  tenderness.  Pulmonary:     Effort: Pulmonary effort is normal.  Abdominal:     Comments: Wart-like growth near navel   Skin:    General: Skin is warm and dry.  Neurological:     Mental Status: She is alert and oriented to person, place, and time. Mental status is at baseline.  Psychiatric:        Mood and Affect: Mood normal.        Behavior: Behavior normal.        Thought Content: Thought content normal.        Judgment: Judgment normal.     Results:  Studies obtained and personally reviewed by me:  Labs:     Component Value Date/Time   NA 137 08/21/2023 0927   K 4.6 08/21/2023 0927   CL 100 08/21/2023 0927   CO2 29 08/21/2023 0927   GLUCOSE 102 (H) 08/21/2023 0927   BUN 21 08/21/2023 0927   CREATININE 0.94 08/21/2023 0927   CALCIUM 10.2 08/21/2023 0927   PROT 6.7 08/21/2023 0927   ALBUMIN 4.3 09/06/2021 1306   AST 15 08/21/2023 0927   ALT 10 08/21/2023 0927   ALKPHOS 57 09/06/2021 1306   BILITOT 0.6 08/21/2023 0927   GFRNONAA >60 09/06/2021 1306   GFRNONAA 52 (L) 07/13/2020 0959   GFRAA 60  07/13/2020 0959    Lab Results  Component Value Date   WBC 5.4 08/21/2023   HGB 15.1 08/21/2023   HCT 46.6 (H) 08/21/2023   MCV 87.4 08/21/2023   PLT 271 08/21/2023   Lab Results  Component Value Date   CHOL 187 08/21/2023   HDL 70 08/21/2023   LDLCALC 97 08/21/2023   TRIG 104 08/21/2023   CHOLHDL 2.7 08/21/2023   Lab Results  Component Value Date   HGBA1C 5.6 08/21/2023    Lab Results  Component Value Date   TSH 0.52 10/09/2023    Assessment & Plan:   Hypothyroidism: Dosage decreased from 88 to 75 mcg Levothyroxine  on 08/26/23. TSH 10/09/23 was 0.52, improved from 0.17 on 08/21/23. Continue current dose.Has 6 month checkup in June.  Mixed hyperlipidemia treated with statin and lipid panel in November was normal.  Hypertension: managed with 25 mg Hydrochlorothiazide  daily. Her blood  pressure today is 110/80.   Minor Wart-like Growth: brought to attention during visit. Near her navel. No concerns. Appears benign. Let us  know if enlarges.  Return in 6 months for chronic follow-up, or as needed  I,Emily Lagle,acting as a scribe for Ronal JINNY Hailstone, MD.,have documented all relevant documentation on the behalf of Ronal JINNY Hailstone, MD,as directed by  Ronal JINNY Hailstone, MD while in the presence of Ronal JINNY Hailstone, MD.   I, Ronal JINNY Hailstone, MD, have reviewed all documentation for this visit. The documentation on 10/19/23 for the exam, diagnosis, procedures, and orders are all accurate and complete.

## 2023-10-19 NOTE — Patient Instructions (Addendum)
 Continue current meds and return in jun for 6 month recheck appt. Labs are stable. BP is stable. Continue current dose of thyroid replacement and lipid medication.

## 2023-11-22 ENCOUNTER — Other Ambulatory Visit: Payer: Self-pay | Admitting: Internal Medicine

## 2023-12-01 ENCOUNTER — Telehealth: Payer: Self-pay | Admitting: Internal Medicine

## 2023-12-01 ENCOUNTER — Other Ambulatory Visit: Payer: Self-pay | Admitting: Internal Medicine

## 2023-12-01 MED ORDER — HYDROCHLOROTHIAZIDE 25 MG PO TABS: 25.0000 mg | ORAL_TABLET | Freq: Every day | ORAL | 3 refills | Status: AC

## 2023-12-01 NOTE — Telephone Encounter (Signed)
 Copied from CRM [id number]. Topic: Clinical - Medication Refill >> Dec 01, 2023  9:50 AM Patsy Lager T wrote: Most Recent Primary Care Visit:  Provider: Margaree Mackintosh  Department: [facility]  Visit Type: OFFICE VISIT  Date: 10/10/2023  Medication: hydrochlorothiazide (HYDRODIURIL) 25 MG tablet  Has the patient contacted their pharmacy? Yes  Is this the correct pharmacy for this prescription? Yes  This is the patient's preferred pharmacy:  [id number] - [place], [place] - [address] [place] [address] Phone: [phone] Fax: [phone]  [id number] [place], [place] - [place] [address] [place] [address] Phone: [phone] Fax: [phone]   Has the prescription been filled recently? Yes  Is the patient out of the medication? Yes  Has the patient been seen for an appointment in the last year OR does the patient have an upcoming appointment? Yes  Can we respond through MyChart? No  Agent: Please be advised that Rx refills may take up to 3 business days. We ask that you follow-up with your pharmacy.

## 2023-12-01 NOTE — Telephone Encounter (Signed)
 Copied from CRM [id number]. Topic: Clinical - Prescription Issue >> Dec 01, 2023  1:30 PM Clayton Bibles wrote: Reason for CRM: hydrochlorothiazide (HYDRODIURIL) 25 MG tablet was sent to the incorrect pharmacy. Please resend the medication to [facility]. Thanks

## 2023-12-01 NOTE — Telephone Encounter (Signed)
 This RN spoke to pharmacy to verify script was rcvd. LVM advising pt.

## 2023-12-01 NOTE — Addendum Note (Signed)
 Addended by: Gregery Na on: 12/01/2023 02:14 PM   Modules accepted: Orders

## 2023-12-16 DIAGNOSIS — H52223 Regular astigmatism, bilateral: Secondary | ICD-10-CM | POA: Diagnosis not present

## 2023-12-20 ENCOUNTER — Other Ambulatory Visit: Payer: Self-pay | Admitting: Internal Medicine

## 2023-12-24 ENCOUNTER — Other Ambulatory Visit: Payer: Self-pay | Admitting: Internal Medicine

## 2023-12-24 NOTE — Telephone Encounter (Signed)
 Copied from CRM [id number]. Topic: Clinical - Medication Refill >> Dec 24, 2023 10:23 AM Dimitri Ped wrote: Most Recent Primary Care Visit:  Provider: Margaree Mackintosh  Department: [facility]  Visit Type: OFFICE VISIT  Date: 10/10/2023  Medication: metoprolol tartrate (LOPRESSOR) 50 MG tablet   Has the patient contacted their pharmacy? Yes no more reflls (Agent: If no, request that the patient contact the pharmacy for the refill. If patient does not wish to contact the pharmacy document the reason why and proceed with request.) (Agent: If yes, when and what did the pharmacy advise?)  Is this the correct pharmacy for this prescription? Yes If no, delete pharmacy and type the correct one.  This is the patient's preferred pharmacy:  [facility] [id number] - [place], [place] - [address] [place] [address] Phone: [phone] Fax: [phone]  [id number] [place], [place] - [place] [address] [place] [address] Phone: [phone] Fax: [phone]   Has the prescription been filled recently? No  Is the patient out of the medication? No 4 left   Has the patient been seen for an appointment in the last year OR does the patient have an upcoming appointment? Yes  Can we respond through MyChart? No  Agent: Please be advised that Rx refills may take up to 3 business days. We ask that you follow-up with your pharmacy.

## 2023-12-30 ENCOUNTER — Other Ambulatory Visit: Payer: Self-pay | Admitting: Internal Medicine

## 2023-12-30 ENCOUNTER — Telehealth: Payer: Self-pay | Admitting: Internal Medicine

## 2023-12-30 MED ORDER — METOPROLOL TARTRATE 50 MG PO TABS: 50.0000 mg | ORAL_TABLET | Freq: Every day | ORAL | 1 refills | Status: DC

## 2023-12-30 NOTE — Telephone Encounter (Signed)
 Pt notified that rx has been sent to correct pharmacy.

## 2023-12-30 NOTE — Telephone Encounter (Signed)
 Looks like this medication was sent to wrong pharmacy

## 2023-12-30 NOTE — Telephone Encounter (Signed)
 Copied from CRM [id number]. Topic: Clinical - Medication Refill >> Dec 30, 2023 12:23 PM Gery Pray wrote: Most Recent Primary Care Visit:  Provider: Margaree Mackintosh  Department: [facility]  Visit Type: OFFICE VISIT  Date: 10/10/2023  Medication: metoprolol tartrate (LOPRESSOR) 50 MG table  Has the patient contacted their pharmacy? Yes (Agent: If no, request that the patient contact the pharmacy for the refill. If patient does not wish to contact the pharmacy document the reason why and proceed with request.) (Agent: If yes, when and what did the pharmacy advise?)  Is this the correct pharmacy for this prescription? Yes If no, delete pharmacy and type the correct one.  This is the patient's preferred pharmacy:  [facility] [id number] - , [place] - [address] [place] [address] Phone: [phone] Fax: [phone]  Has the prescription been filled recently? No  Is the patient out of the medication? Yes  Has the patient been seen for an appointment in the last year OR does the patient have an upcoming appointment? Yes  Can we respond through MyChart? No  Agent: Please be advised that Rx refills may take up to 3 business days. We ask that you follow-up with your pharmacy.

## 2024-01-05 ENCOUNTER — Encounter (HOSPITAL_COMMUNITY): Payer: Self-pay | Admitting: Cardiovascular Disease

## 2024-01-12 ENCOUNTER — Telehealth (HOSPITAL_COMMUNITY): Payer: Self-pay | Admitting: Cardiovascular Disease

## 2024-01-12 NOTE — Telephone Encounter (Signed)
 Just an FYI. We have made several attempts to contact this patient including sending a letter to schedule or reschedule their echocardiogram. We will be removing the patient from the echo WQ.  MAILED LETTER [facility] 01/05/24 [facility] to schedule x 3 @ 12:07/[facility] 12/29/23 [facility] to schedule @ 1:55/[facility] 12/23/23 [facility] to schedule @ 3:29/LBW       Thank you

## 2024-02-10 ENCOUNTER — Ambulatory Visit: Admitting: Internal Medicine

## 2024-02-16 ENCOUNTER — Other Ambulatory Visit: Payer: Self-pay

## 2024-02-16 MED ORDER — ALPRAZOLAM 0.5 MG PO TABS: ORAL_TABLET | ORAL | 5 refills | Status: DC

## 2024-02-26 ENCOUNTER — Ambulatory Visit: Payer: Medicare Other | Admitting: Internal Medicine

## 2024-03-30 ENCOUNTER — Other Ambulatory Visit: Payer: Medicare Other

## 2024-03-30 ENCOUNTER — Ambulatory Visit (INDEPENDENT_AMBULATORY_CARE_PROVIDER_SITE_OTHER): Admitting: Internal Medicine

## 2024-03-30 DIAGNOSIS — F419 Anxiety disorder, unspecified: Secondary | ICD-10-CM

## 2024-03-30 DIAGNOSIS — E039 Hypothyroidism, unspecified: Secondary | ICD-10-CM

## 2024-03-30 DIAGNOSIS — E782 Mixed hyperlipidemia: Secondary | ICD-10-CM

## 2024-03-30 DIAGNOSIS — R251 Tremor, unspecified: Secondary | ICD-10-CM | POA: Diagnosis not present

## 2024-03-30 DIAGNOSIS — R7302 Impaired glucose tolerance (oral): Secondary | ICD-10-CM | POA: Diagnosis not present

## 2024-03-30 DIAGNOSIS — R531 Weakness: Secondary | ICD-10-CM

## 2024-03-30 NOTE — Progress Notes (Addendum)
 Patient Care Team: Amanda Amanda PARAS, MD as PCP - General (Internal Medicine) Amanda Maude BROCKS, MD as PCP - Cardiology (Cardiology)  Visit Date: 03/30/24  Subjective:  Patient [name],Female DOB:1941/06/17,83 y.o. FMW:[id number]   83 y.o.Female presents today for acute visit with Elevated Blood Pressure; Palpitations. Patient has a past medical history of HLD; Hypothyroidism; Anxiety; Irregular Cardiac Rhythm. Initially presented for a lab visit, but blood pressure was elevated at 188/96. Takes Losartan  50 mg daily, HCTZ 25 mg daily, and Metoprolol  tartrate 50 mg daily Obtained an EKG, which was normal. She says she has been dealing with some situational stress, but does not wish to see counseling at this time. Said that she woke up this morning feeling shaky and dizzy. Did not take a Xanax  prior to this visit, takes 0.5 mg at bedtime but has been prescribed to take it 0.5 mg in the mornings and 1 mg at bedtime.   Past Medical History:  Diagnosis Date   Anxiety    Hearing loss of both ears    History of migraines    Hyperlipidemia    Hypertension   No Known Allergies  Family History  Problem Relation Age of Onset   Cancer Father    Breast cancer Neg Hx    Social History   Social History Narrative   Not on file   Review of Systems  Cardiovascular:  Positive for palpitations.  Neurological:  Positive for dizziness and tremors (feel shaky).     Objective:  Vitals: There were no vitals taken for this visit.  Physical Exam Vitals and nursing note reviewed.  Constitutional:      General: She is not in acute distress.    Appearance: Normal appearance. She is not toxic-appearing.  HENT:     Head: Normocephalic and atraumatic.   Cardiovascular:     Rate and Rhythm: Normal rate and regular rhythm. Occasional Extrasystoles are present.    Pulses: Normal pulses.     Heart sounds: Normal heart sounds. No murmur heard.    No friction rub. No gallop.  Pulmonary:      Effort: Pulmonary effort is normal. No respiratory distress.     Breath sounds: Normal breath sounds. No wheezing or rales.   Skin:    General: Skin is warm and dry.   Neurological:     Mental Status: She is alert and oriented to person, place, and time. Mental status is at baseline.   Psychiatric:        Mood and Affect: Mood normal.        Behavior: Behavior normal.        Thought Content: Thought content normal.        Judgment: Judgment normal.     Results:  Studies Obtained And Personally Reviewed By Me:  EKG 03/30/2024 Sinus Rhythm  Marked Sinus Arrhythmia   Labs:     Component Value Date/Time   NA 137 08/21/2023 0927   K 4.6 08/21/2023 0927   CL 100 08/21/2023 0927   CO2 29 08/21/2023 0927   GLUCOSE 102 (H) 08/21/2023 0927   BUN 21 08/21/2023 0927   CREATININE 0.94 08/21/2023 0927   CALCIUM 10.2 08/21/2023 0927   PROT 6.7 08/21/2023 0927   ALBUMIN 4.3 09/06/2021 1306   AST 15 08/21/2023 0927   ALT 10 08/21/2023 0927   ALKPHOS 57 09/06/2021 1306   BILITOT 0.6 08/21/2023 0927   GFRNONAA >60 09/06/2021 1306   GFRNONAA 52 (L) 07/13/2020 0959   GFRAA  60 07/13/2020 0959    Lab Results  Component Value Date   WBC 5.4 08/21/2023   HGB 15.1 08/21/2023   HCT 46.6 (H) 08/21/2023   MCV 87.4 08/21/2023   PLT 271 08/21/2023   Lab Results  Component Value Date   CHOL 187 08/21/2023   HDL 70 08/21/2023   LDLCALC 97 08/21/2023   TRIG 104 08/21/2023   CHOLHDL 2.7 08/21/2023   Lab Results  Component Value Date   HGBA1C 5.6 08/21/2023    Lab Results  Component Value Date   TSH 0.52 10/09/2023    Assessment & Plan:   No orders of the defined types were placed in this encounter.  Elevated Blood Pressure; Hypertension; Irregular Cardiac Rhythm: initially presented for a lab visit, but blood pressure was elevated at 188/96. Takes Losartan  50 mg daily, HCTZ 25 mg daily, and Metoprolol  tartrate 50 mg dailyObtained an EKG, which was normal. Occasional extrasystoles  heard on exam.   Anxiety: she has been dealing with some situational stress, but does not wish to see counseling at this time. Said that she woke up this morning feeling shaky and dizzy. Did not take a Xanax  prior to this visit, takes 0.5 mg at bedtime but has been prescribed to take it 0.5 mg in the mornings and 1 mg at bedtime - recommended taking Xanax  twice daily, once (0.5 mg) in the morning and once (0.5 mg) at bedtime.   Hyperlipidemia treated with Simvastatin  20 mg daily.   Hypothyroidism treated with Levothyroxine  75 mcg daily.  Labs drawn today for annual visit, visit scheduled for 6/26 @ 2:30 PM.    I,[name],acting as a scribe for Amanda Norleen Hailstone, MD.,have documented all relevant documentation on the behalf of Amanda Norleen Hailstone, MD.,as directed by  Amanda Norleen Hailstone, MD. while in the presence of Amanda Norleen Hailstone, MD.   I, Amanda JINNY Hailstone, MD, have reviewed all documentation for this visit. The documentation on 04/04/24 for the exam, diagnosis, procedures, and orders are all accurate and complete.

## 2024-03-31 LAB — HEPATIC FUNCTION PANEL
AG Ratio: 1.8 (calc) (ref 1.0–2.5)
ALT: 13 U/L (ref 6–29)
AST: 15 U/L (ref 10–35)
Albumin: 4.4 g/dL (ref 3.6–5.1)
Alkaline phosphatase (APISO): 54 U/L (ref 37–153)
Bilirubin, Direct: 0.1 mg/dL (ref 0.0–0.2)
Globulin: 2.4 g/dL (ref 1.9–3.7)
Indirect Bilirubin: 0.6 mg/dL (ref 0.2–1.2)
Total Bilirubin: 0.7 mg/dL (ref 0.2–1.2)
Total Protein: 6.8 g/dL (ref 6.1–8.1)

## 2024-03-31 LAB — HEMOGLOBIN A1C
Hgb A1c MFr Bld: 5.6 % (ref <5.7)
Mean Plasma Glucose: 114 mg/dL
eAG (mmol/L): 6.3 mmol/L

## 2024-03-31 LAB — LIPID PANEL
Cholesterol: 171 mg/dL (ref <200)
HDL: 68 mg/dL (ref >=50)
LDL Cholesterol (Calc): 80 mg/dL
Non-HDL Cholesterol (Calc): 103 mg/dL (ref <130)
Total CHOL/HDL Ratio: 2.5 (calc) (ref <5.0)
Triglycerides: 131 mg/dL (ref <150)

## 2024-03-31 LAB — TSH: TSH: 0.58 m[IU]/L (ref 0.40–4.50)

## 2024-03-31 NOTE — Progress Notes (Signed)
 Patient Care Team: Amanda Amanda PARAS, MD as PCP - General (Internal Medicine) Amanda Maude BROCKS, MD as PCP - Cardiology (Cardiology)  Visit Date: 04/01/24  Subjective:   Chief Complaint  Patient presents with   Follow-up    Patient states she feels a lot better than she did Tuesday.    6 month recheck  Patient [name],Female DOB:03-21-1941,83 y.o. FMW:[id number]   83 y.o.Female presents today for 8 months follow-up for Hypothyroidism; Hypertension; Hyperlipidemia. Patient has a past medical history of Irregular Cardiac Rhythm; Anxiety; Insomnia. Seen for annual visit on 08/26/2023, in the interim has not been seen at any offices connected though [facility]. Was seen in-office for an acute visit on 6/24 with an elevated BP and palpitations.   Says that regarding Tuesday, she woke up with palpitation, dizziness/shaking and staggering. When she presented to our office, she had felt so shaky she couldn't walk down the hall. She says that she's had dizziness before, and stays fairly hydrated. Does mention that her house stays quite warm, and she is fairly heat intolerant, so this possibly could have contributed to that episode.   History of Hypothyroidism treated with Levothyroxine  75 mcg daily. 03/30/2024 TSH: 0.58  History of Hypertension; Irregular Cardiac Rhythm treated with HCTZ 25 mg daily, Losartan  50 mg daily, and Metoprolol  tartrate 50 mg daily. Blood Pressure: normotensive today at 112/70. SVT on Holter Monitor 2023 lasting 11-16 beats.   History of Hyperlipidemia treated with Simvastatin  20 mg daily. 03/30/2024 Lipid Panel: WNL.  Reviewed 03/30/2024 HgbA1c: 5.6; Hepatic Function Panel: WNL.  History of Anxiety/Insomnia treated with Xanax  0.5 mg twice daily, once in the morning and once at bedtime.   History of Musculoskeletal Pain treated with Meloxicam  15 mg daily and Flexeril  10 mg 3 times daily as needed.  Past Medical History:  Diagnosis Date   Anxiety     Hearing loss of both ears    History of migraines    Hyperlipidemia    Hypertension     No Known Allergies  Family History  Problem Relation Age of Onset   Cancer Father    Breast cancer Neg Hx    Social History   Social History Narrative   Not on file   Review of Systems  Constitutional:  Negative for fever and malaise/fatigue.  HENT:  Negative for congestion.   Eyes:  Negative for blurred vision.  Respiratory:  Negative for cough and shortness of breath.   Cardiovascular:  Negative for chest pain, palpitations and leg swelling.  Gastrointestinal:  Negative for vomiting.  Musculoskeletal:  Negative for back pain.  Skin:  Negative for rash.  Neurological:  Negative for loss of consciousness and headaches.     Objective:  Vitals: BP 112/70   Pulse 84   Temp 98 F (36.7 C) (Temporal)   Ht 5' 2.5 (1.588 m)   Wt 156 lb 1.9 oz (70.8 kg)   SpO2 95%   BMI 28.10 kg/m   Physical Exam Vitals and nursing note reviewed.  Constitutional:      General: She is not in acute distress.    Appearance: Normal appearance. She is not toxic-appearing.  HENT:     Head: Normocephalic and atraumatic.   Cardiovascular:     Rate and Rhythm: Normal rate and regular rhythm. Occasional Extrasystoles are present.    Pulses: Normal pulses.     Heart sounds: Normal heart sounds. No murmur heard.    No friction rub. No gallop.  Pulmonary:  Effort: Pulmonary effort is normal. No respiratory distress.     Breath sounds: Normal breath sounds. No wheezing or rales.   Skin:    General: Skin is warm and dry.   Neurological:     Mental Status: She is alert and oriented to person, place, and time. Mental status is at baseline.   Psychiatric:        Mood and Affect: Mood normal.        Behavior: Behavior normal.        Thought Content: Thought content normal.        Judgment: Judgment normal.    Results:  Studies Obtained And Personally Reviewed By Me: Labs:     Component Value  Date/Time   NA 137 08/21/2023 0927   K 4.6 08/21/2023 0927   CL 100 08/21/2023 0927   CO2 29 08/21/2023 0927   GLUCOSE 102 (H) 08/21/2023 0927   BUN 21 08/21/2023 0927   CREATININE 0.94 08/21/2023 0927   CALCIUM 10.2 08/21/2023 0927   PROT 6.8 03/30/2024 0945   ALBUMIN 4.3 09/06/2021 1306   AST 15 03/30/2024 0945   ALT 13 03/30/2024 0945   ALKPHOS 57 09/06/2021 1306   BILITOT 0.7 03/30/2024 0945   GFRNONAA >60 09/06/2021 1306   GFRNONAA 52 (L) 07/13/2020 0959   GFRAA 60 07/13/2020 0959    Lab Results  Component Value Date   WBC 5.4 08/21/2023   HGB 15.1 08/21/2023   HCT 46.6 (H) 08/21/2023   MCV 87.4 08/21/2023   PLT 271 08/21/2023   Lab Results  Component Value Date   CHOL 171 03/30/2024   HDL 68 03/30/2024   LDLCALC 80 03/30/2024   TRIG 131 03/30/2024   CHOLHDL 2.5 03/30/2024   Lab Results  Component Value Date   HGBA1C 5.6 03/30/2024    Lab Results  Component Value Date   TSH 0.58 03/30/2024    Assessment & Plan:   Hypothyroidism treated with Levothyroxine  75 mcg daily. 03/30/2024 TSH: 0.58  Hypertension; Irregular Cardiac Rhythm treated with HCTZ 25 mg daily, Losartan  50 mg daily, and Metoprolol  tartrate 50 mg daily. Blood Pressure: normotensive today at 112/70. SVT on Holter Monitor 2023 lasting 11-16 beats.   Hyperlipidemia treated with Simvastatin  20 mg daily. 03/30/2024 Lipid Panel: WNL.  Reviewed 03/30/2024 HgbA1c: 5.6; Hepatic Function Panel: WNL.  Anxiety/Insomnia treated with Xanax  0.5 mg twice daily, once in the morning and once at bedtime.   Musculoskeletal Pain treated with Meloxicam  15 mg daily and Flexeril  10 mg 3 times daily as needed.  Return in 5 months (on 09/07/2024) for annual labs and then on 09/24/2024 for annual visit, or as needed.    I,Emily Lagle,acting as a Neurosurgeon for Amanda JINNY Hailstone, MD.,have documented all relevant documentation on the behalf of Amanda JINNY Hailstone, MD,as directed by  Amanda JINNY Hailstone, MD while in the presence of Amanda JINNY Hailstone, MD.   I, Amanda JINNY Hailstone, MD, have reviewed all documentation for this visit. The documentation on 04/05/24 for the exam, diagnosis, procedures, and orders are all accurate and complete.

## 2024-04-01 ENCOUNTER — Ambulatory Visit: Payer: Self-pay | Admitting: Internal Medicine

## 2024-04-01 ENCOUNTER — Ambulatory Visit (INDEPENDENT_AMBULATORY_CARE_PROVIDER_SITE_OTHER): Payer: Medicare Other | Admitting: Internal Medicine

## 2024-04-01 VITALS — BP 112/70 | HR 84 | Temp 98.0°F | Ht 62.5 in | Wt 156.1 lb

## 2024-04-01 DIAGNOSIS — Z8679 Personal history of other diseases of the circulatory system: Secondary | ICD-10-CM

## 2024-04-01 DIAGNOSIS — M7918 Myalgia, other site: Secondary | ICD-10-CM

## 2024-04-01 DIAGNOSIS — E063 Autoimmune thyroiditis: Secondary | ICD-10-CM | POA: Diagnosis not present

## 2024-04-01 DIAGNOSIS — F411 Generalized anxiety disorder: Secondary | ICD-10-CM

## 2024-04-01 DIAGNOSIS — H903 Sensorineural hearing loss, bilateral: Secondary | ICD-10-CM | POA: Diagnosis not present

## 2024-04-01 DIAGNOSIS — E782 Mixed hyperlipidemia: Secondary | ICD-10-CM | POA: Diagnosis not present

## 2024-04-01 DIAGNOSIS — I10 Essential (primary) hypertension: Secondary | ICD-10-CM

## 2024-04-02 ENCOUNTER — Other Ambulatory Visit: Payer: Self-pay

## 2024-04-02 DIAGNOSIS — E7800 Pure hypercholesterolemia, unspecified: Secondary | ICD-10-CM

## 2024-04-02 MED ORDER — SIMVASTATIN 20 MG PO TABS: 20.0000 mg | ORAL_TABLET | Freq: Every day | ORAL | 7 refills | Status: AC

## 2024-04-04 ENCOUNTER — Encounter: Payer: Self-pay | Admitting: Internal Medicine

## 2024-04-04 NOTE — Progress Notes (Signed)
   Subjective:    Patient ID: [name], female    DOB: 1941-01-01, 83 y.o.   MRN: [id number]  HPI  She was here for lab visit only but felt poorly and was seen urgently for evaluation. Complained of weakness and shakiness.  Blood pressure is elevated at 188/96.  Cannot tell me why she feels weak or shaky.  Denies chest pain or shortness of breath.  History of anxiety treated with Xanax .  History of hypothyroidism.  History of hypertension and hyperlipidemia.  Has no numbness in extremities or face.  No facial weakness.  She has a history of hearing loss, migraine headaches, hypertension and hyperlipidemia.  Takes alprazolam  0.5 mg in the morning and 1 mg at bedtime.  History of mild mitral regurgitation and aortic valve sclerosis/calcification.  Has seen Dr. Nguyen in 2024.  Hypertension is treated with losartan  50 mg daily, metoprolol  50 mg daily and HCTZ 25 mg daily.  She takes simvastatin  20 mg daily for hyperlipidemia history of hypothyroidism treated with thyroid  replacement medication.  History of left carotid bruit.  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  History of hysterectomy with bilateral oophorectomy in 2002.  History of ophthalmic migraine 1993.  History of pneumonia 2003.  History of irritable bowel syndrome.  She is married.  Resides with husband.    Social history: Married.  Completed high school.  Does not smoke or consume alcohol.  Family history: Father died of cancer at age 72.  He had history of MI.  Mother with history of hypertension.  Sister with history of hypertension.  Review of Systems see above-she seems anxious and slightly tremulous     Objective:   Physical Exam Her blood pressure is 112/70, pulse 84 regular, respiratory rate is normal.  Pulse oximetry 95% on room air.  Skin: Warm  and dry.  No cervical adenopathy, thyromegaly or carotid bruits.  Chest clear.  Cardiac exam: Regular rate and rhythm without ectopy.  Neurological exam is intact without gross focal deficits.  No pitting edema of the lower extremities.  Affect thought and judgment are essentially normal except she appears to be anxious.       Assessment & Plan:   Not sure what has caused her symptoms today but she has no neurological deficits to my exam.  Cardiac rhythm is normal without ectopy.  Her blood pressure is stable.  She denies being anxious or upset.  Basically just says she does not fee right  Plan: We are going to allow her to go home with her husband who is driving.  She will rest at home and stay out of hot weather.  She will stay well-hydrated.  We have drawn labs today including lipid panel liver functions TSH hemoglobin A1c.  She will keep her regularly scheduled appointment on June 26.  I, Ronal JINNY Hailstone, MD, have reviewed all documentation for this visit. The documentation on 04/04/24 for the exam, diagnosis, procedures, and orders are all accurate and complete.

## 2024-04-04 NOTE — Patient Instructions (Addendum)
 Rest at home and stay well-hydrated.  Labs were drawn and results are pending.  Return June 26 for follow-up or sooner if worse.

## 2024-04-05 ENCOUNTER — Encounter: Payer: Self-pay | Admitting: Internal Medicine

## 2024-04-05 NOTE — Patient Instructions (Addendum)
 TSH is normal. Continue same dose of thyroid  replacement. Lipid panel is normal. Continue Xanx twice daily. May take Meloxicam  15 mg daily and Flexeril  10 mg 3 times daily as needed for musculoskeletal pain. Hgb AIC is normal and lipid panel is normal.

## 2024-04-06 NOTE — Progress Notes (Unsigned)
 Cardiology Clinic Note   Patient Name: [name] Date of Encounter: 04/08/2024  Primary Care Provider:  [name] PARAS, MD Primary Cardiologist:  Maude Emmer, MD  Patient Profile    [name] 83 year old female presents the clinic today for follow-up evaluation of her blood pressure.  Past Medical History    Past Medical History:  Diagnosis Date   Anxiety    Hearing loss of both ears    History of migraines    Hyperlipidemia    Hypertension    Past Surgical History:  Procedure Laterality Date   ABDOMINAL HYSTERECTOMY     carotid bruit     left   hearing loss     bilateral   migraine       Allergies  No Known Allergies  History of Present Illness    [name] has a PMH of hypertension, hypothyroidism, hearing loss, hyperlipidemia, anxiety, and insomnia.  Echocardiogram 01/02/2023 showed LVEF of 60-65%, G1 DD, moderately dilated left atria, moderate mitral valve regurgitation, aortic valve sclerosis without evidence of aortic stenosis.  She wore a cardiac event monitor 12/23 which showed rare PACs/PVCs with the longest duration being 16 beats.  Her heart rate was 118 bpm.  She was noted to have normal TSH 11/23.  She followed up with Dr. Emmer 12/03/2022.  During that time she denied chest pain, presyncope and noted very limited episodes of skipped beats.  She denied long episodes of rapid palpitations.  Her EKG showed sinus arrhythmia.   She was seen in follow-up by her PCP.  It was recommended that she follow-up with cardiology for further management of her blood pressure.  She presents to the clinic today for evaluation and states over the last few weeks she has noticed increased blood pressure.  She denies increased caffeine or increase sodium in her diet.  She notes that she takes Xanax  before bed.  We reviewed her blood pressure medications.  She has not been very physically active.  She notes that she previously was walking regularly.  She also has  an indoor rowing machine that she has not been using.  She denies alcohol use.  She denies increased stress but does note that she is routinely worried.  I will give her the mindfulness stress reduction, salty 6 diet sheet, stop her losartan , start valsartan 40 mg daily, order a BMP in 1 week and plan follow-up in 1 to 2 months.  Today she denies chest pain, shortness of breath, lower extremity edema, fatigue, palpitations, melena, hematuria, hemoptysis, diaphoresis, weakness, presyncope, syncope, orthopnea, and PND.    Home Medications    Prior to Admission medications   Medication Sig Start Date End Date Taking? Authorizing Provider  ALPRAZolam  (XANAX ) 0.5 MG tablet TAKE 1 TABLET BY MOUTH EVERY MORNING AND 2 AT BEDTIME 02/16/24   [name] PARAS, MD  cholecalciferol (VITAMIN D ) 1000 UNITS tablet Take 2,000 Units by mouth daily.    [provider]  cyclobenzaprine  (FLEXERIL ) 10 MG tablet Take 1 tablet (10 mg total) by mouth 3 (three) times daily as needed for muscle spasms. 04/04/23   [name] PARAS, MD  fluticasone (FLONASE) 50 MCG/ACT nasal spray Place into both nostrils daily.    [provider]  hydrochlorothiazide  (HYDRODIURIL ) 25 MG tablet Take 1 tablet (25 mg total) by mouth daily. 12/01/23   [name] PARAS, MD  levothyroxine  (SYNTHROID ) 75 MCG tablet Take 1 tablet (75 mcg total) by mouth daily. 08/26/23   Baxley,  Ronal PARAS, MD  losartan  (COZAAR ) 50 MG tablet TAKE 1 TABLET EVERY DAY 08/14/23   [name] PARAS, MD  meloxicam  (MOBIC ) 15 MG tablet Take 1 tablet (15 mg total) by mouth daily. 04/04/23   [name] PARAS, MD  metoprolol  tartrate (LOPRESSOR ) 50 MG tablet Take 1 tablet (50 mg total) by mouth daily. 12/30/23   [name] PARAS, MD  simvastatin  (ZOCOR ) 20 MG tablet Take 1 tablet (20 mg total) by mouth daily. 04/02/24   [name] PARAS, MD    Family History    Family History  Problem Relation Age of Onset   Cancer Father    Breast cancer Neg Hx    She indicated that her  mother is deceased. She indicated that her father is deceased. She indicated that her sister is alive. She indicated that her brother is alive. She indicated that the status of her neg hx is unknown.  Social History    Social History   Socioeconomic History   Marital status: Married    Spouse name: Not on file   Number of children: Not on file   Years of education: Not on file   Highest education level: Not on file  Occupational History   Not on file  Tobacco Use   Smoking status: Never   Smokeless tobacco: Never  Substance and Sexual Activity   Alcohol use: Never   Drug use: No   Sexual activity: Not on file  Other Topics Concern   Not on file  Social History Narrative   Not on file   Social Drivers of Health   Financial Resource Strain: Low Risk  (08/26/2023)   Overall Financial Resource Strain (CARDIA)    Difficulty of Paying Living Expenses: Not very hard  Food Insecurity: No Food Insecurity (08/26/2023)   Hunger Vital Sign    Worried About Running Out of Food in the Last Year: Never true    Ran Out of Food in the Last Year: Never true  Transportation Needs: No Transportation Needs (08/26/2023)   PRAPARE - Administrator, Civil Service (Medical): No    Lack of Transportation (Non-Medical): No  Physical Activity: Inactive (08/26/2023)   Exercise Vital Sign    Days of Exercise per Week: 0 days    Minutes of Exercise per Session: 0 min  Stress: No Stress Concern Present (08/26/2023)   [employer] of Occupational Health - Occupational Stress Questionnaire    Feeling of Stress : Not at all  Social Connections: Socially Integrated (08/26/2023)   Social Connection and Isolation Panel    Frequency of Communication with Friends and Family: More than three times a week    Frequency of Social Gatherings with Friends and Family: Once a week    Attends Religious Services: More than 4 times per year    Active Member of [employer] or Organizations: Yes    Attends  Engineer, structural: More than 4 times per year    Marital Status: Married  Catering manager Violence: Not At Risk (08/26/2023)   Humiliation, Afraid, Rape, and Kick questionnaire    Fear of Current or Ex-Partner: No    Emotionally Abused: No    Physically Abused: No    Sexually Abused: No     Review of Systems    General:  No chills, fever, night sweats or weight changes.  Cardiovascular:  No chest pain, dyspnea on exertion, edema, orthopnea, palpitations, paroxysmal nocturnal dyspnea. Dermatological: No rash, lesions/masses Respiratory: No cough, dyspnea Urologic:  No hematuria, dysuria Abdominal:   No nausea, vomiting, diarrhea, bright red blood per rectum, melena, or hematemesis Neurologic:  No visual changes, wkns, changes in mental status. All other systems reviewed and are otherwise negative except as noted above.  Physical Exam    VS:  BP (!) 140/76   Pulse 64   Ht 5' 2.5 (1.588 m)   Wt 158 lb 6.4 oz (71.8 kg)   SpO2 96%   BMI 28.51 kg/m  , BMI Body mass index is 28.51 kg/m. GEN: Well nourished, well developed, in no acute distress. HEENT: normal. Neck: Supple, no JVD, carotid bruits, or masses. Cardiac: RRR, no murmurs, rubs, or gallops. No clubbing, cyanosis, edema.  Radials/DP/PT 2+ and equal bilaterally.  Respiratory:  Respirations regular and unlabored, clear to auscultation bilaterally. GI: Soft, nontender, nondistended, BS + x 4. MS: no deformity or atrophy. Skin: warm and dry, no rash. Neuro:  Strength and sensation are intact. Psych: Normal affect.  Accessory Clinical Findings    Recent Labs: 08/21/2023: BUN 21; Creat 0.94; Hemoglobin 15.1; Platelets 271; Potassium 4.6; Sodium 137 03/30/2024: ALT 13; TSH 0.58   Recent Lipid Panel    Component Value Date/Time   CHOL 171 03/30/2024 0945   TRIG 131 03/30/2024 0945   HDL 68 03/30/2024 0945   CHOLHDL 2.5 03/30/2024 0945   VLDL 21 02/11/2017 1124   LDLCALC 80 03/30/2024 0945     HYPERTENSION CONTROL Vitals:   04/08/24 1404 04/08/24 1422  BP: (!) 157/80 (!) 140/76    The patient's blood pressure is elevated above target today.  In order to address the patient's elevated BP: Blood pressure will be monitored at home to determine if medication changes need to be made.; A new medication was prescribed today.       ECG personally reviewed by me today- EKG Interpretation Date/Time:  Thursday April 08 2024 14:03:10 EDT Ventricular Rate:  60 PR Interval:  168 QRS Duration:  72 QT Interval:  418 QTC Calculation: 418 R Axis:   3  Text Interpretation: Normal sinus rhythm Normal ECG When compared with ECG of 23-Mar-2006 12:59, Nonspecific T wave abnormality no longer evident in Anterolateral leads Confirmed by Emelia Hazy [phone]) on 04/08/2024 2:05:10 PM    Echocardiogram 01/02/2023  IMPRESSIONS     1. Left ventricular ejection fraction, by estimation, is 60 to 65%. The  left ventricle has normal function. The left ventricle has no regional  wall motion abnormalities. There is mild concentric left ventricular  hypertrophy. Left ventricular diastolic  parameters are consistent with Grade I diastolic dysfunction (impaired  relaxation).   2. Right ventricular systolic function is normal. The right ventricular  size is normal. There is normal pulmonary artery systolic pressure. The  estimated right ventricular systolic pressure is 27.6 mmHg.   3. Left atrial size was moderately dilated.   4. The mitral valve is degenerative. Moderate mitral valve regurgitation.   5. Aortic valve regurgitation is not visualized. Aortic valve  sclerosis/calcification is present, without any evidence of aortic  stenosis.   6. The inferior vena cava is normal in size with greater than 50%  respiratory variability, suggesting right atrial pressure of 3 mmHg.   Comparison(s): No prior Echocardiogram.   FINDINGS   Left Ventricle: Left ventricular ejection fraction, by  estimation, is 60  to 65%. The left ventricle has normal function. The left ventricle has no  regional wall motion abnormalities. The left ventricular internal cavity  size was normal in size. There is  mild concentric left ventricular hypertrophy. Left ventricular diastolic  parameters are consistent with Grade I diastolic dysfunction (impaired  relaxation).   Right Ventricle: The right ventricular size is normal. Right ventricular  systolic function is normal. There is normal pulmonary artery systolic  pressure. The tricuspid regurgitant velocity is 2.48 m/s, and with an  assumed right atrial pressure of 3 mmHg,   the estimated right ventricular systolic pressure is 27.6 mmHg.   Left Atrium: Left atrial size was moderately dilated.   Right Atrium: Right atrial size was normal in size.   Pericardium: There is no evidence of pericardial effusion.   Mitral Valve: The mitral valve is degenerative in appearance. Moderate  mitral valve regurgitation.   Tricuspid Valve: Tricuspid valve regurgitation is mild.   Aortic Valve: Aortic valve regurgitation is not visualized. Aortic valve  sclerosis/calcification is present, without any evidence of aortic  stenosis.   Aorta: The aortic root and ascending aorta are structurally normal, with  no evidence of dilitation.   Venous: The inferior vena cava is normal in size with greater than 50%  respiratory variability, suggesting right atrial pressure of 3 mmHg.   IAS/Shunts: No atrial level shunt detected by color flow Doppler.      Assessment & Plan   1.  Essential hypertension-BP today 157/80. Heart healthy low-sodium diet Increase physical activity as tolerated Continue hydrochlorothiazide , metoprolol  Mindfulness stress reduction Stop losartan   Start Valsartan 40 mg daily BMP in 1 week  Hyperlipidemia-LDL 80 on 03/30/24. High-fiber diet Continue simvastatin   Irregular cardiac rhythm, palpitations-heart rate today 64.  Denies  recent episodes of accelerated or irregular heartbeat. Avoid triggers caffeine, chocolate, EtOH, dehydration etc. Maintain p.o. hydration Continue metoprolol   Disposition: Follow-up with Dr. [name] or me in 1-2 months.   Josefa HERO. Sheanna Dail NP-C     04/08/2024, 2:22 PM [facility] [address] Office [phone] Fax [phone]    I spent 14 minutes examining this patient, reviewing medications, and using patient centered shared decision making involving their cardiac care.   I spent  20 minutes reviewing past medical history,  medications, and prior cardiac tests.

## 2024-04-08 ENCOUNTER — Ambulatory Visit: Attending: General Practice | Admitting: General Practice

## 2024-04-08 ENCOUNTER — Encounter: Payer: Self-pay | Admitting: General Practice

## 2024-04-08 VITALS — BP 140/76 | HR 64 | Ht 62.5 in | Wt 158.4 lb

## 2024-04-08 DIAGNOSIS — I499 Cardiac arrhythmia, unspecified: Secondary | ICD-10-CM

## 2024-04-08 DIAGNOSIS — E782 Mixed hyperlipidemia: Secondary | ICD-10-CM | POA: Diagnosis not present

## 2024-04-08 DIAGNOSIS — I10 Essential (primary) hypertension: Secondary | ICD-10-CM | POA: Diagnosis not present

## 2024-04-08 MED ORDER — VALSARTAN 40 MG PO TABS: 40.0000 mg | ORAL_TABLET | Freq: Every day | ORAL | 0 refills | Status: DC

## 2024-04-08 NOTE — Patient Instructions (Addendum)
 Medication Instructions:  STOP LOSARTAN  START VALSARTAN *If you need a refill on your cardiac medications before your next appointment, please call your pharmacy*  Lab Work: BMET IN 1 WEEK OUR LAB IS ON THE FIRST FLOOR If you have labs (blood work) drawn today and your tests are completely normal, you will receive your results only by: MyChart Message (if you have MyChart) OR A paper copy in the mail If you have any lab test that is abnormal or we need to change your treatment, we will call you to review the results.  Testing/Procedures: NONE  Follow-Up: At [facility], you and your health needs are our priority.  As part of our continuing mission to provide you with exceptional heart care, our providers are all part of one team.  This team includes your primary Cardiologist (physician) and Advanced Practice Providers or APPs (Physician Assistants and Nurse Practitioners) who all work together to provide you with the care you need, when you need it.  Your next appointment:   1-2 month(s)  Provider:   JOSEFA BEAUVAIS, NP Other Instructions Mindfulness-Based Stress Reduction Mindfulness-based stress reduction (MBSR) is a program that helps people learn to practice mindfulness. Mindfulness is the practice of consciously paying attention to the present moment. MBSR focuses on developing self-awareness, which lets you respond to life stress without judgment or negative feelings. It can be learned and practiced through techniques such as education, breathing exercises, meditation, and yoga. MBSR includes several mindfulness techniques in one program. MBSR works best when you understand the treatment, are willing to try new things, and can commit to spending time practicing what you learn. MBSR training may include learning about: How your feelings, thoughts, and reactions affect your body. New ways to respond to things that cause negative thoughts to start (triggers). How to notice  your thoughts and let go of them. Practicing awareness of everyday things that you normally do without thinking. The techniques and goals of different types of meditation. What are the benefits of MBSR? MBSR can have many benefits, which include helping you to: Develop self-awareness. This means knowing and understanding yourself. Learn skills and attitudes that help you to take part in your own health care. Learn new ways to care for yourself. Be more accepting about how things are, and let things go. Be less judgmental and approach things with an open mind. Be patient with yourself and trust yourself more. MBSR has also been shown to: Reduce negative emotions, such as sadness, overwhelm, and worry. Improve memory and focus. Change how you sense and react to pain. Boost your body's ability to fight infections. Help you connect better with other people. Improve your sense of well-being. How to practice mindfulness To do a basic awareness exercise: Find a comfortable place to sit. Pay attention to the present moment. Notice your thoughts, feelings, and surroundings just as they are. Avoid judging yourself, your feelings, or your surroundings. Make note of any judgment that comes up and let it go. Your mind may wander, and that is okay. Make note of when your thoughts drift, and return your attention to the present moment. To do basic mindfulness meditation: Find a comfortable place to sit. This may include a stable chair or a firm floor cushion. Sit upright with your back straight. Let your arms fall next to your sides, with your hands resting on your legs. If you are sitting in a chair, rest your feet flat on the floor. If you are sitting on a cushion,  cross your legs in front of you. Keep your head in a neutral position with your chin dropped slightly. Relax your jaw and rest the tip of your tongue on the roof of your mouth. Drop your gaze to the floor or close your eyes. Breathe  normally and pay attention to your breath. Feel the air moving in and out of your nose. Feel your belly expanding and relaxing with each breath. Your mind may wander, and that is okay. Make note of when your thoughts drift, and return your attention to your breath. Avoid judging yourself, your feelings, or your surroundings. Make note of any judgment or feelings that come up, let them go, and bring your attention back to your breath. When you are ready, lift your gaze or open your eyes. Pay attention to how your body feels after the meditation. Follow these instructions at home:  Find a local in-person or online MBSR program. Set aside some time regularly for mindfulness practice. Practice every day if you can. Even 10 minutes of practice is helpful. Find a mindfulness practice that works best for you. This may include one or more of the following: Meditation. This involves focusing your mind on a certain thought or activity. Breathing awareness exercises. These help you to stay present by focusing on your breath. Body scan. For this practice, you lie down and pay attention to each part of your body from head to toe. You can identify tension and soreness and consciously relax parts of your body. Yoga. Yoga involves stretching and breathing, and it can improve your ability to move and be flexible. It can also help you to test your body's limits, which can help you release stress. Mindful eating. This way of eating involves focusing on the taste, texture, color, and smell of each bite of food. This slows down eating and helps you feel full sooner. For this reason, it can be an important part of a weight loss plan. Find a podcast or recording that provides guidance for breathing awareness, body scan, or meditation exercises. You can listen to these any time when you have a free moment to rest without distractions. Follow your treatment plan as told by your health care provider. This may include taking  regular medicines and making changes to your diet or lifestyle as recommended. Where to find more information You can find more information about MBSR from: Your health care provider. Community-based meditation centers or programs. Programs offered near you. Summary Mindfulness-based stress reduction (MBSR) is a program that teaches you how to consciously pay attention to the present moment. It is used to help you deal better with daily stress, feelings, and pain. MBSR focuses on developing self-awareness, which allows you to respond to life stress without judgment or negative feelings. MBSR programs may involve learning different mindfulness practices, such as breathing exercises, meditation, yoga, body scan, or mindful eating. Find a mindfulness practice that works best for you, and set aside time for it on a regular basis. This information is not intended to replace advice given to you by your health care provider. Make sure you discuss any questions you have with your health care provider. Document Revised: 05/03/2021 Document Reviewed: 05/03/2021 Elsevier Patient Education  2024 [employer]   SALTY 6 DIET  Physical Activity With Heart Disease Being active has many benefits, especially if you have heart disease. Physical activity can help you do more and feel healthier. Start slowly, and increase the amount of time you spend being active. You should  aim for physical activity that: Makes you breathe harder and raises your heart rate (aerobic activity). Try to get at least 150 minutes of aerobic activity each week. This is about 30 minutes each day, 5 days a week. Helps build muscle strength (strengthening activity). Do this at least 2 times a week. A good rule of thumb is to work hard enough to breathe harder but still be able to carry on a conversation. If you can sing, you may not be working hard enough. You may also want to monitor your heart rate (pulse) and blood pressure. Ask your  health care provider what kind of tools you will need to track these. Always talk with your health care provider before starting any new activity program or if you have any changes in your condition. What are the benefits of physical activity? Physical activity can help improve your heart and blood vessel (cardiovascular) health. It can: Lower your blood pressure. Lower your cholesterol. Control your weight. Help control your blood sugar. Improve the function of your heart and lungs. Reduce your risk of developing blood clots. Physical activity can help improve other aspects of your health. It can: Prevent bone loss. Improve your sleep. Improve your energy level. Reduce stress. What are some types of physical activity I could try? There are many ways to be active. Talk with your health care provider about what types and intensity of activity is right for you. Aerobic activity  Aerobic (cardiovascular) activity can be moderate or vigorous intensity, depending on how hard you are working. Moderate-intensity activity includes: Walking. Slow bicycling. Water aerobics. Dancing. Light gardening or house work. Vigorous-intensity activity includes: Jogging or running. Stair climbing. Swimming laps. Hiking uphill. Heavy gardening, such as digging trenches.  Strengthening activity Strengthening activities work your muscles to build strength. Some examples include: Doing push-ups, sit-ups, or pull-ups. Lifting small weights. Using resistance bands. Yoga.  Flexibility Flexibility activities lengthen your muscles to keep them flexible and less tight and improve your balance. Some examples include: Stretching. Yoga. Tai chi. Evalene barre.  Follow these instructions at home: How to get started Talk with your health care provider about: What types of activities are safe for you. If you should check your pulse or take other precautions during physical activity. Get a calendar.  Write down a schedule and plan for your new routine. At the start of your workout, as well as at the end, remember to warm up and cool down to allow a gradual increase or decrease in heart rate and breathing. If you have not been active, begin with sessions that last 10-15 minutes. Gradually work up to sessions that last 20-30 minutes, 5 times a week. Follow all of your health care provider's recommendations. Take time to find out what works for you. Consider the following: Join a community program, such as a biking group, yoga class, local gym, or swimming pool membership. Be active on your own by downloading free workout applications on a smartphone or other devices, or by purchasing workout DVDs. Be patient with yourself. It takes time to build up strength and lung capacity. Safety Exercise in an indoor, climate-controlled facility, as told by your health care provider. You may need to do this if: There are extreme outdoor conditions, such as heat, humidity, or cold. There is an air pollution advisory. Your local news, board of health, or hospital can provide information on air quality. Take extra precautions as told by your health care provider. This may include: Monitoring your heart rate.  Avoiding heavy lifting. Understanding how your medicines can affect you during physical activity. Certain medicines may cause heat intolerance or changes in blood sugar. Slowing down to rest when you need to. Keeping nitroglycerin spray and tablets with you at all times if you have angina. Use them as told to prevent and treat symptoms. Drink plenty of water before, during, and after physical activity. Know what symptoms may be signs of a problem and stop physical activity right away if you have any of those symptoms. Where to find more information American Heart Association: www.heart.org [place] Department of Health and Human Services: [employer]ly.at Get help right away if you have any of the following  during exercise: Chest pain, shortness of breath, or feel very tired. Pain in the arm, shoulder, neck, or jaw. You feel weak, dizzy, or light-headed. An irregular heart rate, or your heart rate is greater than 100 beats per minute (bpm) before exercise. These symptoms may represent a serious problem that is an emergency. Do not wait to see if the symptoms go away. Get medical help right away. Call your local emergency services (911 in the [place] Do not drive yourself to the hospital.  Summary Physical activity has many benefits, especially if you have heart disease. Before starting an activity program, talk with your health care provider about how often to be active and what type of activity is safe for you. Your physical activity plan may include moderate or vigorous aerobic activity, strengthening activities, and flexibility. Know what symptoms may be signs of a problem. Stop physical activity right away and call emergency services (911 in the [place] if you have any of those symptoms. This information is not intended to replace advice given to you by your health care provider. Make sure you discuss any questions you have with your health care provider. Document Revised: 03/06/2021 Document Reviewed: 03/06/2021 Elsevier Patient Education  2024 [employer].

## 2024-04-15 DIAGNOSIS — I10 Essential (primary) hypertension: Secondary | ICD-10-CM | POA: Diagnosis not present

## 2024-04-15 DIAGNOSIS — I499 Cardiac arrhythmia, unspecified: Secondary | ICD-10-CM | POA: Diagnosis not present

## 2024-04-15 DIAGNOSIS — E782 Mixed hyperlipidemia: Secondary | ICD-10-CM | POA: Diagnosis not present

## 2024-04-16 ENCOUNTER — Ambulatory Visit: Payer: Self-pay | Admitting: General Practice

## 2024-04-16 LAB — BASIC METABOLIC PANEL WITH GFR
BUN/Creatinine Ratio: 18 (ref 12–28)
BUN: 17 mg/dL (ref 8–27)
CO2: 23 mmol/L (ref 20–29)
Calcium: 9.7 mg/dL (ref 8.7–10.3)
Chloride: 96 mmol/L (ref 96–106)
Creatinine, Ser: 0.93 mg/dL (ref 0.57–1.00)
Glucose: 118 mg/dL — ABNORMAL HIGH (ref 70–99)
Potassium: 4.2 mmol/L (ref 3.5–5.2)
Sodium: 135 mmol/L (ref 134–144)
eGFR: 60 mL/min/1.73 (ref >59)

## 2024-05-30 NOTE — Progress Notes (Unsigned)
 Cardiology Clinic Note   Patient Name: [name] Date of Encounter: 06/01/2024  Primary Care Provider:  [name] PARAS, MD Primary Cardiologist:  Maude Emmer, MD  Patient Profile    [name] 83 year old female presents the clinic today for follow-up evaluation of her blood pressure.  Past Medical History    Past Medical History:  Diagnosis Date   Anxiety    Hearing loss of both ears    History of migraines    Hyperlipidemia    Hypertension    Past Surgical History:  Procedure Laterality Date   ABDOMINAL HYSTERECTOMY     carotid bruit     left   hearing loss     bilateral   migraine       Allergies  No Known Allergies  History of Present Illness    [name] has a PMH of hypertension, hypothyroidism, hearing loss, hyperlipidemia, anxiety, and insomnia.  Echocardiogram 01/02/2023 showed LVEF of 60-65%, G1 DD, moderately dilated left atria, moderate mitral valve regurgitation, aortic valve sclerosis without evidence of aortic stenosis.  She wore a cardiac event monitor 12/23 which showed rare PACs/PVCs with the longest duration being 16 beats.  Her heart rate was 118 bpm.  She was noted to have normal TSH 11/23.  She followed up with Dr. Emmer 12/03/2022.  During that time she denied chest pain, presyncope and noted very limited episodes of skipped beats.  She denied long episodes of rapid palpitations.  Her EKG showed sinus arrhythmia.   She was seen in follow-up by her PCP.  It was recommended that she follow-up with cardiology for further management of her blood pressure.  She presented to the clinic 04/08/24 for evaluation and stated over the last few weeks she had noticed increased blood pressure.  She denied increased caffeine or increase sodium in her diet.  She noted that she takes Xanax  before bed.  We reviewed her blood pressure medications.  She had not been very physically active.  She noted that she previously was walking regularly.  She also  had an indoor rowing machine that she had not been using.  She denied alcohol use.  She denied increased stress but did note that she was routinely worried.  I gave her the mindfulness stress reduction, salty 6 diet sheet, stopped her losartan , started valsartan  40 mg daily, ordered a BMP in 1 week and planned follow-up in 1 to 2 months.  She presents to the clinic today for follow-up evaluation and states she has been monitoring her blood pressure at home.  It has been better controlled with the transition from losartan  to valsartan .  She has had a few elevated blood pressures in the 150-140 range.  Most of her elevated blood pressures are noted to be in the 130s.  In the clinic today her blood pressure is 144/79 and on recheck it is 142/72.  We reviewed secondary causes of hypertension and her stress.  She notes that she has continuous amount of stress and is quite worried all the time.  I will increase her valsartan  to 80 mg daily, give her the mindfulness stress reduction sheet, order BMP and 1 week and plan follow-up in 3 to 4 months.  Today she denies chest pain, shortness of breath, lower extremity edema, fatigue, palpitations, melena, hematuria, hemoptysis, diaphoresis, weakness, presyncope, syncope, orthopnea, and PND.    Home Medications    Prior to Admission medications   Medication Sig Start Date End Date Taking? Authorizing  Provider  ALPRAZolam  (XANAX ) 0.5 MG tablet TAKE 1 TABLET BY MOUTH EVERY MORNING AND 2 AT BEDTIME 02/16/24   [name] PARAS, MD  cholecalciferol (VITAMIN D ) 1000 UNITS tablet Take 2,000 Units by mouth daily.    [provider]  cyclobenzaprine  (FLEXERIL ) 10 MG tablet Take 1 tablet (10 mg total) by mouth 3 (three) times daily as needed for muscle spasms. 04/04/23   [name] PARAS, MD  fluticasone (FLONASE) 50 MCG/ACT nasal spray Place into both nostrils daily.    [provider]  hydrochlorothiazide  (HYDRODIURIL ) 25 MG tablet Take 1 tablet (25 mg total)  by mouth daily. 12/01/23   [name] PARAS, MD  levothyroxine  (SYNTHROID ) 75 MCG tablet Take 1 tablet (75 mcg total) by mouth daily. 08/26/23   [name] PARAS, MD  losartan  (COZAAR ) 50 MG tablet TAKE 1 TABLET EVERY DAY 08/14/23   [name] PARAS, MD  meloxicam  (MOBIC ) 15 MG tablet Take 1 tablet (15 mg total) by mouth daily. 04/04/23   [name] PARAS, MD  metoprolol  tartrate (LOPRESSOR ) 50 MG tablet Take 1 tablet (50 mg total) by mouth daily. 12/30/23   [name] PARAS, MD  simvastatin  (ZOCOR ) 20 MG tablet Take 1 tablet (20 mg total) by mouth daily. 04/02/24   [name] PARAS, MD    Family History    Family History  Problem Relation Age of Onset   Cancer Father    Breast cancer Neg Hx    She indicated that her mother is deceased. She indicated that her father is deceased. She indicated that her sister is alive. She indicated that her brother is alive. She indicated that the status of her neg hx is unknown.  Social History    Social History   Socioeconomic History   Marital status: Married    Spouse name: Not on file   Number of children: Not on file   Years of education: Not on file   Highest education level: Not on file  Occupational History   Not on file  Tobacco Use   Smoking status: Never   Smokeless tobacco: Never  Substance and Sexual Activity   Alcohol use: Never   Drug use: No   Sexual activity: Not on file  Other Topics Concern   Not on file  Social History Narrative   Not on file   Social Drivers of Health   Financial Resource Strain: Low Risk  (08/26/2023)   Overall Financial Resource Strain (CARDIA)    Difficulty of Paying Living Expenses: Not very hard  Food Insecurity: No Food Insecurity (08/26/2023)   Hunger Vital Sign    Worried About Running Out of Food in the Last Year: Never true    Ran Out of Food in the Last Year: Never true  Transportation Needs: No Transportation Needs (08/26/2023)   PRAPARE - Administrator, Civil Service (Medical): No     Lack of Transportation (Non-Medical): No  Physical Activity: Inactive (08/26/2023)   Exercise Vital Sign    Days of Exercise per Week: 0 days    Minutes of Exercise per Session: 0 min  Stress: No Stress Concern Present (08/26/2023)   [employer] of Occupational Health - Occupational Stress Questionnaire    Feeling of Stress : Not at all  Social Connections: Socially Integrated (08/26/2023)   Social Connection and Isolation Panel    Frequency of Communication with Friends and Family: More than three times a week    Frequency of Social Gatherings with Friends and  Family: Once a week    Attends Religious Services: More than 4 times per year    Active Member of Clubs or Organizations: Yes    Attends Banker Meetings: More than 4 times per year    Marital Status: Married  Catering manager Violence: Not At Risk (08/26/2023)   Humiliation, Afraid, Rape, and Kick questionnaire    Fear of Current or Ex-Partner: No    Emotionally Abused: No    Physically Abused: No    Sexually Abused: No     Review of Systems    General:  No chills, fever, night sweats or weight changes.  Cardiovascular:  No chest pain, dyspnea on exertion, edema, orthopnea, palpitations, paroxysmal nocturnal dyspnea. Dermatological: No rash, lesions/masses Respiratory: No cough, dyspnea Urologic: No hematuria, dysuria Abdominal:   No nausea, vomiting, diarrhea, bright red blood per rectum, melena, or hematemesis Neurologic:  No visual changes, wkns, changes in mental status. All other systems reviewed and are otherwise negative except as noted above.  Physical Exam    VS:  BP (!) 142/72   Pulse 62   Ht 5' 2 (1.575 m)   Wt 156 lb (70.8 kg)   SpO2 98%   BMI 28.53 kg/m  , BMI Body mass index is 28.53 kg/m. GEN: Well nourished, well developed, in no acute distress. HEENT: normal. Neck: Supple, no JVD, carotid bruits, or masses. Cardiac: RRR, no murmurs, rubs, or gallops. No clubbing, cyanosis,  edema.  Radials/DP/PT 2+ and equal bilaterally.  Respiratory:  Respirations regular and unlabored, clear to auscultation bilaterally. GI: Soft, nontender, nondistended, BS + x 4. MS: no deformity or atrophy. Skin: warm and dry, no rash. Neuro:  Strength and sensation are intact. Psych: Normal affect.  Accessory Clinical Findings    Recent Labs: 08/21/2023: Hemoglobin 15.1; Platelets 271 03/30/2024: ALT 13; TSH 0.58 04/15/2024: BUN 17; Creatinine, Ser 0.93; Potassium 4.2; Sodium 135   Recent Lipid Panel    Component Value Date/Time   CHOL 171 03/30/2024 0945   TRIG 131 03/30/2024 0945   HDL 68 03/30/2024 0945   CHOLHDL 2.5 03/30/2024 0945   VLDL 21 02/11/2017 1124   LDLCALC 80 03/30/2024 0945    HYPERTENSION CONTROL Vitals:   06/01/24 1141 06/01/24 1218  BP: (!) 144/79 (!) 142/72    The patient's blood pressure is elevated above target today.  In order to address the patient's elevated BP: Blood pressure will be monitored at home to determine if medication changes need to be made.; A current anti-hypertensive medication was adjusted today.       ECG personally reviewed by me today-none today      Echocardiogram 01/02/2023  IMPRESSIONS     1. Left ventricular ejection fraction, by estimation, is 60 to 65%. The  left ventricle has normal function. The left ventricle has no regional  wall motion abnormalities. There is mild concentric left ventricular  hypertrophy. Left ventricular diastolic  parameters are consistent with Grade I diastolic dysfunction (impaired  relaxation).   2. Right ventricular systolic function is normal. The right ventricular  size is normal. There is normal pulmonary artery systolic pressure. The  estimated right ventricular systolic pressure is 27.6 mmHg.   3. Left atrial size was moderately dilated.   4. The mitral valve is degenerative. Moderate mitral valve regurgitation.   5. Aortic valve regurgitation is not visualized. Aortic valve   sclerosis/calcification is present, without any evidence of aortic  stenosis.   6. The inferior vena cava is normal in size  with greater than 50%  respiratory variability, suggesting right atrial pressure of 3 mmHg.   Comparison(s): No prior Echocardiogram.   FINDINGS   Left Ventricle: Left ventricular ejection fraction, by estimation, is 60  to 65%. The left ventricle has normal function. The left ventricle has no  regional wall motion abnormalities. The left ventricular internal cavity  size was normal in size. There is   mild concentric left ventricular hypertrophy. Left ventricular diastolic  parameters are consistent with Grade I diastolic dysfunction (impaired  relaxation).   Right Ventricle: The right ventricular size is normal. Right ventricular  systolic function is normal. There is normal pulmonary artery systolic  pressure. The tricuspid regurgitant velocity is 2.48 m/s, and with an  assumed right atrial pressure of 3 mmHg,   the estimated right ventricular systolic pressure is 27.6 mmHg.   Left Atrium: Left atrial size was moderately dilated.   Right Atrium: Right atrial size was normal in size.   Pericardium: There is no evidence of pericardial effusion.   Mitral Valve: The mitral valve is degenerative in appearance. Moderate  mitral valve regurgitation.   Tricuspid Valve: Tricuspid valve regurgitation is mild.   Aortic Valve: Aortic valve regurgitation is not visualized. Aortic valve  sclerosis/calcification is present, without any evidence of aortic  stenosis.   Aorta: The aortic root and ascending aorta are structurally normal, with  no evidence of dilitation.   Venous: The inferior vena cava is normal in size with greater than 50%  respiratory variability, suggesting right atrial pressure of 3 mmHg.   IAS/Shunts: No atrial level shunt detected by color flow Doppler.      Assessment & Plan   1.  Essential hypertension-BP today 142/72.  Tolerating  valsartan  well.  Follow-up BMP 04/15/2024 showed stable electrolytes and renal function. Heart healthy low-sodium diet Increase physical activity as tolerated Continue hydrochlorothiazide , metoprolol  Increase valsartan  80 mg daily Reviewed secondary causes of hypertension. Repeart BMP in 1 week Mindfulness stress reduction  Irregular cardiac rhythm, palpitations-heart rate today 62.  Denies palpitations or irregular heartbeats.   Avoid triggers caffeine, chocolate, EtOH, dehydration etc.-reviewed Maintain p.o. hydration Continue metoprolol   Hyperlipidemia-LDL 80 on 03/30/24. High-fiber diet Continue simvastatin   Disposition: Follow-up with Dr. Nishan or me in 3-4 months.   Josefa HERO. Alberto Pina NP-C     06/01/2024, 12:43 PM [facility] [address] Office [phone] Fax [phone]    I spent 14 minutes examining this patient, reviewing medications, and using patient centered shared decision making involving their cardiac care.   I spent  20 minutes reviewing past medical history,  medications, and prior cardiac tests.

## 2024-06-01 ENCOUNTER — Encounter: Payer: Self-pay | Admitting: General Practice

## 2024-06-01 ENCOUNTER — Ambulatory Visit: Attending: General Practice | Admitting: General Practice

## 2024-06-01 VITALS — BP 142/72 | HR 62 | Ht 62.0 in | Wt 156.0 lb

## 2024-06-01 DIAGNOSIS — I10 Essential (primary) hypertension: Secondary | ICD-10-CM | POA: Diagnosis not present

## 2024-06-01 DIAGNOSIS — I499 Cardiac arrhythmia, unspecified: Secondary | ICD-10-CM

## 2024-06-01 DIAGNOSIS — E782 Mixed hyperlipidemia: Secondary | ICD-10-CM | POA: Diagnosis not present

## 2024-06-01 MED ORDER — VALSARTAN 80 MG PO TABS
80.0000 mg | ORAL_TABLET | Freq: Every day | ORAL | 3 refills | Status: AC
Start: 2024-06-01 — End: ?

## 2024-06-01 NOTE — Patient Instructions (Signed)
 Medication Instructions:  Your physician has recommended you make the following change in your medication:  INCREASE VALSARTAN  TO 80 MG DAILY.  *If you need a refill on your cardiac medications before your next appointment, please call your pharmacy*  Lab Work: TO BE DONE IN 1 WEEK: BMET If you have labs (blood work) drawn today and your tests are completely normal, you will receive your results only by: MyChart Message (if you have MyChart) OR A paper copy in the mail If you have any lab test that is abnormal or we need to change your treatment, we will call you to review the results.  Testing/Procedures: NONE  Follow-Up: At [facility], you and your health needs are our priority.  As part of our continuing mission to provide you with exceptional heart care, our providers are all part of one team.  This team includes your primary Cardiologist (physician) and Advanced Practice Providers or APPs (Physician Assistants and Nurse Practitioners) who all work together to provide you with the care you need, when you need it.  Your next appointment:   3-4 month(s)  Provider:   Maude Emmer, MD  We recommend signing up for the patient portal called MyChart.  Sign up information is provided on this After Visit Summary.  MyChart is used to connect with patients for Virtual Visits (Telemedicine).  Patients are able to view lab/test results, encounter notes, upcoming appointments, etc.  Non-urgent messages can be sent to your provider as well.   To learn more about what you can do with MyChart, go to [email or web address].   Other Instructions Mindfulness-Based Stress Reduction: What to Know Mindfulness-based stress reduction (MBSR) is a mindfulness meditation program that normally takes place over 8 weeks. It usually includes weekly group classes and daily exercises to do at home. What are the benefits of MBSR? Mindfulness meditation therapies, like MBSR, can change a person's brain  and body in good ways, and make them healthier. MBSR can have many benefits, such as: Helping to lower stress hormones. Decreasing symptoms or helping to deal with symptoms of different conditions, like: Anxiety, which is feeling worried or nervous. Long-lasting pain. This is pain that lasts more than 3 months. Stress and worry. Trouble sleeping. Headaches, like migraines and tension headaches. Irritable bowel syndrome. Helping to handle stress from things you can't control, like: Long-term illnesses, especially if you have a lot of pain or other difficult symptoms. Big life events. Stress at work. Stress from taking care of someone else. Types of MBSR exercises Mindfulness. This is a common type of meditation. Meditation. It helps you focus your mind to feel calm and happy. It has two main parts: paying attention and accepting. Paying attention means focusing on what is happening right now. This usually means noticing your breathing, your thoughts, how your body feels, and your emotions. Accepting means noticing these feelings and sensations without judging them. Instead of reacting to these thoughts or feelings, you just observe them and let them pass. MSBR exercises include: Body scanning. This is a mindfulness exercise where you pay attention to how different parts of your body feel. You can do this while lying down or sitting up. Sitting meditations. In this exercise, you focus on something like your breathing. When your mind starts to wander, gently bring it back to your breath. Keep doing this every time you notice your mind wandering. Mindful movements. This exercise involves moving and stretching your body slowly while paying attention to how it feels. Mindful Tasks. This  means paying attention to how your body feels while doing things like walking or eating. Follow these instructions at home:  Find an in-person MBSR program or find a program that is online. Find a podcast or  recording that provides guidance for MSBR exercises. Look for a therapist who knows how to use MBSR. Follow your treatment plan as told by your health care provider. This may include taking regular medicines and making changes to your diet or lifestyle. Where to find more information You can find more information about MBSR from: Your provider. Community-based meditation centers or programs. American Psychological Association at http://forbes-duran.com/. This information is not intended to replace advice given to you by your health care provider. Make sure you discuss any questions you have with your health care provider. Document Revised: 11/27/2023 Document Reviewed: 11/27/2023 Elsevier Patient Education  2025 [employer].

## 2024-06-11 DIAGNOSIS — I10 Essential (primary) hypertension: Secondary | ICD-10-CM | POA: Diagnosis not present

## 2024-06-11 DIAGNOSIS — I499 Cardiac arrhythmia, unspecified: Secondary | ICD-10-CM | POA: Diagnosis not present

## 2024-06-12 LAB — BASIC METABOLIC PANEL WITH GFR
BUN/Creatinine Ratio: 19 (ref 12–28)
BUN: 20 mg/dL (ref 8–27)
CO2: 21 mmol/L (ref 20–29)
Calcium: 10 mg/dL (ref 8.7–10.3)
Chloride: 97 mmol/L (ref 96–106)
Creatinine, Ser: 1.05 mg/dL — ABNORMAL HIGH (ref 0.57–1.00)
Glucose: 91 mg/dL (ref 70–99)
Potassium: 5.1 mmol/L (ref 3.5–5.2)
Sodium: 136 mmol/L (ref 134–144)
eGFR: 52 mL/min/1.73 — ABNORMAL LOW (ref >59)

## 2024-06-13 ENCOUNTER — Ambulatory Visit: Payer: Self-pay | Admitting: General Practice

## 2024-06-28 ENCOUNTER — Other Ambulatory Visit: Payer: Self-pay

## 2024-06-28 MED ORDER — METOPROLOL TARTRATE 50 MG PO TABS: 50.0000 mg | ORAL_TABLET | Freq: Every day | ORAL | 1 refills | Status: AC

## 2024-07-03 ENCOUNTER — Other Ambulatory Visit: Payer: Self-pay | Admitting: General Practice

## 2024-08-11 ENCOUNTER — Other Ambulatory Visit: Payer: Self-pay

## 2024-08-11 MED ORDER — LEVOTHYROXINE SODIUM 75 MCG PO TABS: 75.0000 ug | ORAL_TABLET | Freq: Every day | ORAL | 3 refills | Status: DC

## 2024-08-13 NOTE — Progress Notes (Signed)
 [name]                                          MRN: [id number]   08/13/2024   The [facility] Quality Team Specialist reviewed this patient medical record for the purposes of chart review for care gap closure. The following were reviewed: chart review for care gap closure-controlling blood pressure.    [facility] Quality Team

## 2024-08-19 NOTE — Progress Notes (Signed)
 CARDIOLOGY CONSULT NOTE       Patient ID: [name] MRN: [id number] DOB/AGE: [birth year] 83 y.o.  Referring Physician: Baxley Primary Physician: [name] PARAS, MD Primary Cardiologist: [name]   HPI:  83 y.o. referred by Dr Perri for arrhythmia and palpitations  First seen by me 12/03/22 History of HLD, HTN, Anxiety hypothyroidism and hearing loss ECG with sinus arrhythmia and PAC;s  Monitor 09/22/22 Rare PAC/PVC  Longest duration 16 beats at HR 118 fastes only 11 beats rate 179.  She is on lopressor  for HTN.  S She is on synthroid  replacement with normal TSH 08/16/22  With diuretic for her BP K 4.2 normal renal function   Echo done 01/02/23 with normal RV/LV function moderate MR and LAE  On statin LDL 87   She has no presyncope, chest pain Very limited skips and no long episodes of rapid palpitations ECG in office today with sinus arrhythmia   Seen by PA 04/08/24 with increased BP active using indoor rowing machine  and walks regularly No change in diet no ETOH. Uses xanax  at bedtie Losartan  d/c and started on valsartan  40 mg -> 80 mg daily     ROS All other systems reviewed and negative except as noted above  Past Medical History:  Diagnosis Date   Anxiety    Hearing loss of both ears    History of migraines    Hyperlipidemia    Hypertension     Family History  Problem Relation Age of Onset   Cancer Father    Breast cancer Neg Hx     Social History   Socioeconomic History   Marital status: Married    Spouse name: Not on file   Number of children: Not on file   Years of education: Not on file   Highest education level: Not on file  Occupational History   Not on file  Tobacco Use   Smoking status: Never   Smokeless tobacco: Never  Substance and Sexual Activity   Alcohol use: Never   Drug use: No   Sexual activity: Not on file  Other Topics Concern   Not on file  Social History Narrative   Not on file   Social Drivers of Health   Financial Resource  Strain: Low Risk  (08/26/2023)   Overall Financial Resource Strain (CARDIA)    Difficulty of Paying Living Expenses: Not very hard  Food Insecurity: No Food Insecurity (08/26/2023)   Hunger Vital Sign    Worried About Running Out of Food in the Last Year: Never true    Ran Out of Food in the Last Year: Never true  Transportation Needs: No Transportation Needs (08/26/2023)   PRAPARE - Administrator, Civil Service (Medical): No    Lack of Transportation (Non-Medical): No  Physical Activity: Inactive (08/26/2023)   Exercise Vital Sign    Days of Exercise per Week: 0 days    Minutes of Exercise per Session: 0 min  Stress: No Stress Concern Present (08/26/2023)   [employer] of Occupational Health - Occupational Stress Questionnaire    Feeling of Stress : Not at all  Social Connections: Socially Integrated (08/26/2023)   Social Connection and Isolation Panel    Frequency of Communication with Friends and Family: More than three times a week    Frequency of Social Gatherings with Friends and Family: Once a week    Attends Religious Services: More than 4 times per year    Active Member of Clubs or  Organizations: Yes    Attends Engineer, Structural: More than 4 times per year    Marital Status: Married  Catering Manager Violence: Not At Risk (08/26/2023)   Humiliation, Afraid, Rape, and Kick questionnaire    Fear of Current or Ex-Partner: No    Emotionally Abused: No    Physically Abused: No    Sexually Abused: No    Past Surgical History:  Procedure Laterality Date   ABDOMINAL HYSTERECTOMY     carotid bruit     left   hearing loss     bilateral   migraine         Current Outpatient Medications:    ALPRAZolam  (XANAX ) 0.5 MG tablet, TAKE 1 TABLET BY MOUTH EVERY MORNING AND 2 AT BEDTIME, Disp: 90 tablet, Rfl: 5   cholecalciferol (VITAMIN D ) 1000 UNITS tablet, Take 2,000 Units by mouth daily., Disp: , Rfl:    fluticasone (FLONASE) 50 MCG/ACT nasal spray,  Place into both nostrils daily., Disp: , Rfl:    hydrochlorothiazide  (HYDRODIURIL ) 25 MG tablet, Take 1 tablet (25 mg total) by mouth daily., Disp: 90 tablet, Rfl: 3   levothyroxine  (SYNTHROID ) 75 MCG tablet, Take 1 tablet (75 mcg total) by mouth daily., Disp: 90 tablet, Rfl: 3   metoprolol  tartrate (LOPRESSOR ) 50 MG tablet, Take 1 tablet (50 mg total) by mouth daily., Disp: 90 tablet, Rfl: 1   simvastatin  (ZOCOR ) 20 MG tablet, Take 1 tablet (20 mg total) by mouth daily., Disp: 45 tablet, Rfl: 7   valsartan  (DIOVAN ) 80 MG tablet, Take 1 tablet (80 mg total) by mouth daily., Disp: 90 tablet, Rfl: 3   cyclobenzaprine  (FLEXERIL ) 10 MG tablet, Take 1 tablet (10 mg total) by mouth 3 (three) times daily as needed for muscle spasms. (Patient not taking: Reported on 08/27/2024), Disp: 90 tablet, Rfl: 0   meloxicam  (MOBIC ) 15 MG tablet, Take 1 tablet (15 mg total) by mouth daily. (Patient not taking: Reported on 08/27/2024), Disp: 90 tablet, Rfl: 0  Current Facility-Administered Medications:    ipratropium (ATROVENT ) 0.06 % nasal spray 2 spray, 2 spray, Each Nare, TID, Baxley, Ronal PARAS, MD  ipratropium  2 spray Each Nare TID     Physical Exam: Blood pressure 120/64, pulse 85, height 5' 2 (1.575 m), weight 155 lb (70.3 kg), SpO2 97%.    Affect appropriate Healthy:  appears stated age HEENT: normal Neck supple with no adenopathy JVP normal no bruits no thyromegaly Lungs clear with no wheezing and good diaphragmatic motion Heart:  S1/S2 no murmur, no rub, gallop or click PMI normal Abdomen: benighn, BS positve, no tenderness, no AAA no bruit.  No HSM or HJR Distal pulses intact with no bruits No edema Neuro non-focal Skin warm and dry No muscular weakness  Labs:   Lab Results  Component Value Date   WBC 5.4 08/21/2023   HGB 15.1 08/21/2023   HCT 46.6 (H) 08/21/2023   MCV 87.4 08/21/2023   PLT 271 08/21/2023   No results for input(s): NA, K, CL, CO2, BUN, CREATININE,  CALCIUM, PROT, BILITOT, ALKPHOS, ALT, AST, GLUCOSE in the last 168 hours.  Invalid input(s): LABALBU No results found for: CKTOTAL, CKMB, CKMBINDEX, TROPONINI  Lab Results  Component Value Date   CHOL 171 03/30/2024   CHOL 187 08/21/2023   CHOL 193 02/14/2023   Lab Results  Component Value Date   HDL 68 03/30/2024   HDL 70 08/21/2023   HDL 68 02/14/2023   Lab Results  Component Value Date  LDLCALC 80 03/30/2024   LDLCALC 97 08/21/2023   LDLCALC 105 (H) 02/14/2023   Lab Results  Component Value Date   TRIG 131 03/30/2024   TRIG 104 08/21/2023   TRIG 108 02/14/2023   Lab Results  Component Value Date   CHOLHDL 2.5 03/30/2024   CHOLHDL 2.7 08/21/2023   CHOLHDL 2.8 02/14/2023   No results found for: LDLDIRECT    Radiology: No results found.  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 On beta blocker in 83 yo would not recommend AAT or ablation referral. TSH/lytes are normal  TTE 01/2023 with normal EF moderate MR and LAE HTN:  improved with change in ARB to Valsartan  and DASH diet Also on diuretic and lopressor   HLD:  continue statin LDL 80 sufficient in 83 yo with no documented vascular dx Anxiety:  PRN xanax  Stress contributes to labile BP MR:  moderate should not be clinically significant in 83 yo with normal RV/LV function no loud murmur on exam no need for TTE at this time   F/U cardiology in a year   Signed: Maude Emmer 08/27/2024, 9:59 AM

## 2024-08-27 ENCOUNTER — Ambulatory Visit: Attending: Cardiovascular Disease | Admitting: Cardiovascular Disease

## 2024-08-27 ENCOUNTER — Encounter: Payer: Self-pay | Admitting: Cardiovascular Disease

## 2024-08-27 VITALS — BP 120/64 | HR 85 | Ht 62.0 in | Wt 155.0 lb

## 2024-08-27 DIAGNOSIS — I499 Cardiac arrhythmia, unspecified: Secondary | ICD-10-CM | POA: Diagnosis not present

## 2024-08-27 DIAGNOSIS — I340 Nonrheumatic mitral (valve) insufficiency: Secondary | ICD-10-CM | POA: Diagnosis not present

## 2024-08-27 DIAGNOSIS — E782 Mixed hyperlipidemia: Secondary | ICD-10-CM | POA: Diagnosis not present

## 2024-08-27 DIAGNOSIS — R002 Palpitations: Secondary | ICD-10-CM

## 2024-08-27 DIAGNOSIS — I10 Essential (primary) hypertension: Secondary | ICD-10-CM

## 2024-08-27 NOTE — Patient Instructions (Signed)
 Medication Instructions:   Your physician recommends that you continue on your current medications as directed. Please refer to the Current Medication list given to you today.  *If you need a refill on your cardiac medications before your next appointment, please call your pharmacy*    Lab Work: NONE ORDERED  TODAY     If you have labs (blood work) drawn today and your tests are completely normal, you will receive your results only by: MyChart Message (if you have MyChart) OR A paper copy in the mail If you have any lab test that is abnormal or we need to change your treatment, we will call you to review the results.    Testing/Procedures: NONE ORDERED  TODAY     Follow-Up: At [facility], you and your health needs are our priority.  As part of our continuing mission to provide you with exceptional heart care, our providers are all part of one team.  This team includes your primary Cardiologist (physician) and Advanced Practice Providers or APPs (Physician Assistants and Nurse Practitioners) who all work together to provide you with the care you need, when you need it.   Your next appointment:    1 year(s)   Provider:    Maude Emmer, MD     We recommend signing up for the patient portal called MyChart.  Sign up information is provided on this After Visit Summary.  MyChart is used to connect with patients for Virtual Visits (Telemedicine).  Patients are able to view lab/test results, encounter notes, upcoming appointments, etc.  Non-urgent messages can be sent to your provider as well.   To learn more about what you can do with MyChart, go to [email or web address].   Other Instructions

## 2024-09-07 ENCOUNTER — Other Ambulatory Visit: Payer: Self-pay

## 2024-09-07 DIAGNOSIS — R7302 Impaired glucose tolerance (oral): Secondary | ICD-10-CM | POA: Diagnosis not present

## 2024-09-07 DIAGNOSIS — E7800 Pure hypercholesterolemia, unspecified: Secondary | ICD-10-CM

## 2024-09-07 DIAGNOSIS — I10 Essential (primary) hypertension: Secondary | ICD-10-CM

## 2024-09-07 DIAGNOSIS — E782 Mixed hyperlipidemia: Secondary | ICD-10-CM

## 2024-09-07 DIAGNOSIS — Z0000 Encounter for general adult medical examination without abnormal findings: Secondary | ICD-10-CM | POA: Diagnosis not present

## 2024-09-07 DIAGNOSIS — E063 Autoimmune thyroiditis: Secondary | ICD-10-CM

## 2024-09-08 LAB — CBC WITH DIFFERENTIAL/PLATELET
Absolute Lymphocytes: 1637 {cells}/uL (ref 850–3900)
Absolute Monocytes: 494 {cells}/uL (ref 200–950)
Basophils Absolute: 48 {cells}/uL (ref 0–200)
Basophils Relative: 1 %
Eosinophils Absolute: 82 {cells}/uL (ref 15–500)
Eosinophils Relative: 1.7 %
HCT: 44.8 % (ref 35.9–46.0)
Hemoglobin: 14.8 g/dL (ref 11.7–15.5)
MCH: 28.6 pg (ref 27.0–33.0)
MCHC: 33 g/dL (ref 31.6–35.4)
MCV: 86.7 fL (ref 81.4–101.7)
MPV: 10.6 fL (ref 7.5–12.5)
Monocytes Relative: 10.3 %
Neutro Abs: 2539 {cells}/uL (ref 1500–7800)
Neutrophils Relative %: 52.9 %
Platelets: 283 Thousand/uL (ref 140–400)
RBC: 5.17 Million/uL — ABNORMAL HIGH (ref 3.80–5.10)
RDW: 13.1 % (ref 11.0–15.0)
Total Lymphocyte: 34.1 %
WBC: 4.8 Thousand/uL (ref 3.8–10.8)

## 2024-09-08 LAB — LIPID PANEL
Cholesterol: 157 mg/dL (ref <200)
HDL: 65 mg/dL (ref >=50)
LDL Cholesterol (Calc): 75 mg/dL
Non-HDL Cholesterol (Calc): 92 mg/dL (ref <130)
Total CHOL/HDL Ratio: 2.4 (calc) (ref <5.0)
Triglycerides: 87 mg/dL (ref <150)

## 2024-09-08 LAB — HEMOGLOBIN A1C
Hgb A1c MFr Bld: 5.5 % (ref <5.7)
Mean Plasma Glucose: 111 mg/dL
eAG (mmol/L): 6.2 mmol/L

## 2024-09-08 LAB — COMPREHENSIVE METABOLIC PANEL WITH GFR
AG Ratio: 2 (calc) (ref 1.0–2.5)
ALT: 11 U/L (ref 6–29)
AST: 16 U/L (ref 10–35)
Albumin: 4.3 g/dL (ref 3.6–5.1)
Alkaline phosphatase (APISO): 48 U/L (ref 37–153)
BUN/Creatinine Ratio: 18 (calc) (ref 6–22)
BUN: 19 mg/dL (ref 7–25)
CO2: 28 mmol/L (ref 20–32)
Calcium: 10.2 mg/dL (ref 8.6–10.4)
Chloride: 104 mmol/L (ref 98–110)
Creat: 1.04 mg/dL — ABNORMAL HIGH (ref 0.60–0.95)
Globulin: 2.2 g/dL (ref 1.9–3.7)
Glucose, Bld: 99 mg/dL (ref 65–99)
Potassium: 4.7 mmol/L (ref 3.5–5.3)
Sodium: 143 mmol/L (ref 135–146)
Total Bilirubin: 0.6 mg/dL (ref 0.2–1.2)
Total Protein: 6.5 g/dL (ref 6.1–8.1)
eGFR: 53 mL/min/1.73m2 — ABNORMAL LOW (ref >=60)

## 2024-09-08 LAB — TSH: TSH: 0.31 m[IU]/L — ABNORMAL LOW (ref 0.40–4.50)

## 2024-09-10 NOTE — Progress Notes (Signed)
 "  Annual Wellness Visit   Patient Care Team: [name], Amanda PARAS, MD as PCP - General (Internal Medicine) Amanda Maude BROCKS, MD as PCP - Cardiology (Cardiology)  Visit Date: 09/24/2024   Chief Complaint  Patient presents with   Annual Exam   Medicare Wellness   Subjective:  Patient: [name], Female DOB: 1941-08-01, 83 y.o. MRN: [id number] Vitals:   09/24/24 1355  BP: 128/70   [name] is a 83 y.o. Female who presents today for her Annual Wellness Visit. Patient has Hypothyroidism; Hyperlipidemia; Hypertension; Anxiety; Irregular cardiac rhythm; Insomnia; and Bilateral hearing loss on their problem list. istory of anxiety treated with alprazolam  0.5 mg morning and 1 mg at bedtime.   She said that he is having issues with GE Reflux. She said that after she eats she feels that her chest burns. She said that this used to happen infrequently but is now happening more often.    History of hypertension treated with valsartan  80 mg daily, metoprolol  tartrate 50 mg daily, hydrochlorothiazide  25 mg daily. Blood pressure normal today at 128/70. She saw Dr. [name] on 08/27/2024.    History of hyperlipidemia treated with simvastatin  20 mg daily. Lipid panel normal.   History of hypothyroidism treated with levothyroxine  77 mcg daily. TSH low at 0.31.   History of left carotid bruit.  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  Has had no neurological symptoms.  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   She underwent hysterectomy in 2002 with bilateral oophorectomy.  History of ophthalmic migraine 1993.  History of pneumonia 2003.   Was seen in the emergency department December 2022 with bright red blood per rectum. Stools were formed and not bloody. She has history of irritable bowel syndrome. Has not wanted to have colonoscopy. Occult blood testing in the  emergency department was negative. No further issues. Did see [name], [name] January 2023.    Has declined colonoscopy in the past.   Labs 09/07/2024  RBC 5.17, Creatinine 1.04, eGFR 53, TSH 0.31, Otherwise WNL.     09/19/2022 mammogram No evidence of malignancy. Small benign cysts within the upper LEFT breast, largest measuring 7 mm, corresponding to the mammographic findings. Patient may return to routine annual bilateral screening mammogram schedule.   Vaccine counseling: Tetanus vaccine due.   Health maintenance: Mammogram due.   Health Maintenance  Topic Date Due   Zoster Vaccines- Shingrix (1 of 2) 01/22/1960   DTaP/Tdap/Td (2 - Td or Tdap) 12/30/2022   Medicare Annual Wellness (AWV)  08/25/2024   COVID-19 Vaccine (7 - Pfizer risk 2025-26 season) 01/18/2025   Pneumococcal Vaccine: 50+ Years  Completed   Influenza Vaccine  Completed   Bone Density Scan  Completed   Meningococcal B Vaccine  Aged Out      Review of Systems  Constitutional:  Negative for fever and malaise/fatigue.  HENT:  Negative for congestion.   Eyes:  Negative for blurred vision.  Respiratory:  Negative for cough and shortness of breath.   Cardiovascular:  Negative for chest pain, palpitations and leg swelling.  Gastrointestinal:  Negative for vomiting.  Musculoskeletal:  Negative for back pain.  Skin:  Negative for rash.  Neurological:  Negative for loss of consciousness and headaches.   Objective:  Vitals: body mass index is 28.35 kg/m. Today's Vitals   09/24/24 1355  BP: 128/70  Pulse: 63  SpO2: 97%  Weight: 155 lb (70.3 kg)  Height: 5' 2 (1.575 m)  PainSc: 0-No pain   Physical Exam Vitals and nursing note reviewed.  Constitutional:      General: She is not in acute distress.    Appearance: Normal appearance. She is not ill-appearing or toxic-appearing.  HENT:     Head: Normocephalic and atraumatic.     Right Ear: Hearing, tympanic membrane, ear canal and external ear normal.      Left Ear: Hearing, tympanic membrane, ear canal and external ear normal.     Mouth/Throat:     Pharynx: Oropharynx is clear.  Eyes:     Extraocular Movements: Extraocular movements intact.     Pupils: Pupils are equal, round, and reactive to light.  Neck:     Thyroid : No thyroid  mass, thyromegaly or thyroid  tenderness.     Vascular: No carotid bruit.  Cardiovascular:     Rate and Rhythm: Normal rate and regular rhythm. No extrasystoles are present.    Pulses:          Dorsalis pedis pulses are 2+ on the right side and 2+ on the left side.     Heart sounds: Normal heart sounds. No murmur heard.    No friction rub. No gallop.  Pulmonary:     Effort: Pulmonary effort is normal.     Breath sounds: Normal breath sounds. No decreased breath sounds, wheezing, rhonchi or rales.  Chest:     Chest wall: No mass.  Breasts:    Right: Normal.     Left: Normal.  Abdominal:     Palpations: Abdomen is soft. There is no hepatomegaly, splenomegaly or mass.     Tenderness: There is no abdominal tenderness.     Hernia: No hernia is present.  Musculoskeletal:     Cervical back: Normal range of motion.     Right lower leg: No edema.     Left lower leg: No edema.  Lymphadenopathy:     Cervical: No cervical adenopathy.     Upper Body:     Right upper body: No supraclavicular adenopathy.     Left upper body: No supraclavicular adenopathy.  Skin:    General: Skin is warm and dry.  Neurological:     General: No focal deficit present.     Mental Status: She is alert and oriented to person, place, and time. Mental status is at baseline.     Sensory: Sensation is intact.     Motor: Motor function is intact. No weakness.     Deep Tendon Reflexes: Reflexes are normal and symmetric.  Psychiatric:        Attention and Perception: Attention normal.        Mood and Affect: Mood normal.        Speech: Speech normal.        Behavior: Behavior normal.        Thought Content: Thought content normal.         Cognition and Memory: Cognition normal.        Judgment: Judgment normal.     Current Outpatient Medications  Medication Instructions   ALPRAZolam  (XANAX ) 0.5 MG tablet TAKE 1 TABLET BY MOUTH EVERY MORNING AND 2 AT BEDTIME   cholecalciferol (VITAMIN D ) 2,000 Units, Daily   fluticasone (FLONASE) 50 MCG/ACT nasal spray Daily   hydrochlorothiazide  (HYDRODIURIL ) 25 mg, Oral, Daily   levothyroxine  (SYNTHROID ) 50 mcg, Oral, Daily   metoprolol  tartrate (LOPRESSOR ) 50 mg, Oral, Daily  pantoprazole  (PROTONIX ) 40 mg, Oral, Daily   simvastatin  (ZOCOR ) 20 mg, Oral, Daily   valsartan  (DIOVAN ) 80 mg, Oral, Daily   Past Medical History:  Diagnosis Date   Anxiety    Hearing loss of both ears    History of migraines    Hyperlipidemia    Hypertension    Medical/Surgical History Narrative:  Allergic/Intolerant to: No Known Allergies  Past Surgical History:  Procedure Laterality Date   ABDOMINAL HYSTERECTOMY     carotid bruit     left   hearing loss     bilateral   migraine      Family History  Problem Relation Age of Onset   Cancer Father    Breast cancer Neg Hx    Family History Narrative: Father died of cancer at age 60. He had history of MI. Mother with history of hypertension. Sister with history of hypertension.   Social history: She is married. Completed high school. She is retired. Does not smoke or consume alcohol.   Most Recent Health Risks Assessment:   Most Recent Social Determinants of Health (Including Hx of Tobacco, Alcohol, and Drug Use) SDOH Screenings   Food Insecurity: No Food Insecurity (09/24/2024)  Housing: Low Risk (09/24/2024)  Transportation Needs: No Transportation Needs (09/24/2024)  Utilities: Not At Risk (09/24/2024)  Alcohol Screen: Low Risk (09/24/2024)  Depression (PHQ2-9): Low Risk (09/24/2024)  Financial Resource Strain: Low Risk (09/24/2024)  Physical Activity: Inactive (09/24/2024)  Social Connections: Socially Integrated (09/24/2024)   Stress: No Stress Concern Present (09/24/2024)  Tobacco Use: Low Risk (09/24/2024)  Health Literacy: Adequate Health Literacy (09/24/2024)   Social History   Tobacco Use   Smoking status: Never   Smokeless tobacco: Never  Substance Use Topics   Alcohol use: Never   Drug use: No   Most Recent Fall Risk Assessment:    09/24/2024    1:59 PM  Fall Risk   Falls in the past year? 0  Number falls in past yr: 0  Injury with Fall? 0  Risk for fall due to : No Fall Risks  Follow up Falls prevention discussed;Education provided;Falls evaluation completed   Most Recent Anxiety/Depression Screenings:    09/24/2024    2:01 PM 04/01/2024    4:25 PM  PHQ 2/9 Scores  PHQ - 2 Score 0 0      04/01/2024    2:46 PM  GAD 7 : Generalized Anxiety Score  Nervous, Anxious, on Edge 0  Control/stop worrying 0  Worry too much - different things 0  Trouble relaxing 0  Restless 0  Easily annoyed or irritable 0  Afraid - awful might happen 0  Total GAD 7 Score 0  Anxiety Difficulty Not difficult at all   Most Recent Cognitive Screening:    09/24/2024    1:59 PM  6CIT Screen  What Year? 0 points  What month? 0 points  What time? 0 points  Count back from 20 0 points  Months in reverse 0 points  Repeat phrase 0 points  Total Score 0 points    Results:  Studies Obtained And Personally Reviewed By Me: 09/19/2022 mammogram No evidence of malignancy. Small benign cysts within the upper LEFT breast, largest measuring 7 mm, corresponding to the mammographic findings. Patient may return to routine annual bilateral screening mammogram schedule.    Labs:  CBC w/ Differential Lab Results  Component Value Date   WBC 4.8 09/07/2024   RBC 5.17 (H) 09/07/2024   HGB 14.8 09/07/2024   HCT  44.8 09/07/2024   PLT 283 09/07/2024   MCV 86.7 09/07/2024   MCH 28.6 09/07/2024   MCHC 33.0 09/07/2024   RDW 13.1 09/07/2024   MPV 10.6 09/07/2024   LYMPHSABS 1,724 08/16/2022   MONOABS 0.5 09/06/2021    BASOSABS 48 09/07/2024    Comprehensive Metabolic Panel Lab Results  Component Value Date   NA 143 09/07/2024   K 4.7 09/07/2024   CL 104 09/07/2024   CO2 28 09/07/2024   GLUCOSE 99 09/07/2024   BUN 19 09/07/2024   CREATININE 1.04 (H) 09/07/2024   CALCIUM 10.2 09/07/2024   PROT 6.5 09/07/2024   ALBUMIN 4.3 09/06/2021   AST 16 09/07/2024   ALT 11 09/07/2024   ALKPHOS 57 09/06/2021   BILITOT 0.6 09/07/2024   EGFR 53 (L) 09/07/2024   GFRNONAA >60 09/06/2021   Lipid Panel  Lab Results  Component Value Date   CHOL 157 09/07/2024   HDL 65 09/07/2024   LDLCALC 75 09/07/2024   TRIG 87 09/07/2024   A1c Lab Results  Component Value Date   HGBA1C 5.5 09/07/2024    TSH Lab Results  Component Value Date   TSH 0.31 (L) 09/07/2024    Assessment & Plan:   Orders Placed This Encounter  Procedures   MM 3D DIAGNOSTIC MAMMOGRAM BILATERAL BREAST    Reason for Exam (SYMPTOM  OR DIAGNOSIS REQUIRED):   health maintenance    Preferred imaging location?:   [facility]   Microalbumin / creatinine urine ratio   POCT URINALYSIS DIP (CLINITEK)   Meds ordered this encounter  Medications   levothyroxine  (SYNTHROID ) 50 MCG tablet    Sig: Take 1 tablet (50 mcg total) by mouth daily.    Dispense:  90 tablet    Refill:  1   pantoprazole  (PROTONIX ) 40 MG tablet    Sig: Take 1 tablet (40 mg total) by mouth daily.    Dispense:  90 tablet    Refill:  1   GE Reflux: She said that he is having issues with GE Reflux. She said that after she eats she feels that her chest burns. She said that this used to happen infrequently but is now happening more often.     Protonix  40 mg daily prescribed   Hypertension: treated with valsartan  80 mg daily, metoprolol  tartrate 50 mg daily, hydrochlorothiazide  25 mg daily. Blood pressure normal today at 128/70. She saw Dr. [name] on 08/27/2024.    Hyperlipidemia: treated with simvastatin  20 mg daily. Lipid panel normal.   Hypothyroidism: treated  with levothyroxine  77 mcg daily. TSH low at 0.31. Need to adjust dose of thyroid  replacement medication so dose will be lowered from 75 mcg daily to 50 mcgs daily with f/u in 6 weeks  Levothyroxine  50 mcg daily prescribed.    History of left carotid bruit.  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  Has had no neurological symptoms. Consider repeat study in the future.   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     Has declined colonoscopy in the past.  09/19/2022 mammogram No evidence of malignancy. Small benign cysts within the upper LEFT breast, largest measuring 7 mm, corresponding to the mammographic findings. Patient may return to routine annual bilateral screening mammogram schedule.   Vaccine counseling: Tetanus vaccine due.   Health maintenance: Mammogram due.  Mammogram ordered.  Annual Wellness Visit done today including the all of the following: Reviewed patient's Family Medical History Reviewed patient's SDOH and reviewed tobacco, alcohol, and drug use.  Reviewed and updated list of patient's medical providers Assessment of cognitive impairment was done Assessed patient's functional ability Established a written schedule for health screening services Health Risk Assessent Completed and Reviewed  Discussed health benefits of physical activity, and encouraged her to engage in regular exercise appropriate for her age and condition.    I,Makayla C Reid,acting as a scribe for Amanda JINNY Hailstone, MD.,have documented all relevant documentation on the behalf of Amanda JINNY Hailstone, MD,as directed by  Amanda JINNY Hailstone, MD while in the presence of Amanda JINNY Hailstone, MD.   I, Amanda JINNY Hailstone, MD, have reviewed all documentation for and agree with the above Annual Wellness Visit documentation.  Amanda JINNY Hailstone, MD Internal Medicine 09/24/2024 "

## 2024-09-14 ENCOUNTER — Telehealth: Payer: Self-pay | Admitting: Internal Medicine

## 2024-09-14 MED ORDER — ALPRAZOLAM 0.5 MG PO TABS: ORAL_TABLET | ORAL | 5 refills | Status: AC

## 2024-09-14 NOTE — Telephone Encounter (Signed)
 Refilled Xanax  as requested. MJB, MD

## 2024-09-24 ENCOUNTER — Ambulatory Visit: Payer: Self-pay | Admitting: Internal Medicine

## 2024-09-24 VITALS — BP 128/70 | HR 63 | Ht 62.0 in | Wt 155.0 lb

## 2024-09-24 DIAGNOSIS — E039 Hypothyroidism, unspecified: Secondary | ICD-10-CM | POA: Diagnosis not present

## 2024-09-24 DIAGNOSIS — H903 Sensorineural hearing loss, bilateral: Secondary | ICD-10-CM | POA: Diagnosis not present

## 2024-09-24 DIAGNOSIS — Z1231 Encounter for screening mammogram for malignant neoplasm of breast: Secondary | ICD-10-CM

## 2024-09-24 DIAGNOSIS — E785 Hyperlipidemia, unspecified: Secondary | ICD-10-CM | POA: Diagnosis not present

## 2024-09-24 DIAGNOSIS — R7302 Impaired glucose tolerance (oral): Secondary | ICD-10-CM

## 2024-09-24 DIAGNOSIS — I10 Essential (primary) hypertension: Secondary | ICD-10-CM

## 2024-09-24 DIAGNOSIS — R0989 Other specified symptoms and signs involving the circulatory and respiratory systems: Secondary | ICD-10-CM

## 2024-09-24 DIAGNOSIS — K2100 Gastro-esophageal reflux disease with esophagitis, without bleeding: Secondary | ICD-10-CM

## 2024-09-24 DIAGNOSIS — E7800 Pure hypercholesterolemia, unspecified: Secondary | ICD-10-CM

## 2024-09-24 DIAGNOSIS — E782 Mixed hyperlipidemia: Secondary | ICD-10-CM

## 2024-09-24 DIAGNOSIS — F411 Generalized anxiety disorder: Secondary | ICD-10-CM

## 2024-09-24 DIAGNOSIS — E063 Autoimmune thyroiditis: Secondary | ICD-10-CM

## 2024-09-24 DIAGNOSIS — Z0000 Encounter for general adult medical examination without abnormal findings: Secondary | ICD-10-CM | POA: Diagnosis not present

## 2024-09-24 DIAGNOSIS — K580 Irritable bowel syndrome with diarrhea: Secondary | ICD-10-CM

## 2024-09-24 DIAGNOSIS — Z6828 Body mass index (BMI) 28.0-28.9, adult: Secondary | ICD-10-CM

## 2024-09-24 DIAGNOSIS — Z8679 Personal history of other diseases of the circulatory system: Secondary | ICD-10-CM

## 2024-09-24 LAB — POCT URINALYSIS DIP (CLINITEK)
Bilirubin, UA: NEGATIVE
Blood, UA: NEGATIVE
Glucose, UA: NEGATIVE mg/dL
Ketones, POC UA: NEGATIVE mg/dL
Leukocytes, UA: NEGATIVE
Nitrite, UA: NEGATIVE
POC PROTEIN,UA: NEGATIVE
Spec Grav, UA: 1.01
Urobilinogen, UA: 0.2 U/dL
pH, UA: 6.5

## 2024-09-24 MED ORDER — PANTOPRAZOLE SODIUM 40 MG PO TBEC: 40.0000 mg | DELAYED_RELEASE_TABLET | Freq: Every day | ORAL | 1 refills | Status: AC

## 2024-09-24 MED ORDER — LEVOTHYROXINE SODIUM 50 MCG PO TABS: 50.0000 ug | ORAL_TABLET | Freq: Every day | ORAL | 1 refills | Status: AC

## 2024-09-24 NOTE — Patient Instructions (Addendum)
 It was a pleasure to see you today. Return in 6 months. Have prescribed Protonix  for reflux daily. Changing dose of thyroid  replacement. Follow up in 6 weeks.  [name],  Thank you for taking the time for your Medicare Wellness Visit. I appreciate your continued commitment to your health goals. Please review the care plan we discussed, and feel free to reach out if I can assist you further.  Please note that Annual Wellness Visits do not include a physical exam. Some assessments may be limited, especially if the visit was conducted virtually. If needed, we may recommend an in-person follow-up with your provider.  Ongoing Care Seeing your primary care provider every 3 to 6 months helps us  monitor your health and provide consistent, personalized care.   Referrals If a referral was made during today's visit and you haven't received any updates within two weeks, please contact the referred provider directly to check on the status.  Recommended Screenings:  Health Maintenance  Topic Date Due   Zoster (Shingles) Vaccine (1 of 2) 01/22/1960   DTaP/Tdap/Td vaccine (2 - Td or Tdap) 12/30/2022   COVID-19 Vaccine (7 - Pfizer risk 2025-26 season) 01/18/2025   Medicare Annual Wellness Visit  09/24/2025   Pneumococcal Vaccine for age over 67  Completed   Flu Shot  Completed   Osteoporosis screening with Bone Density Scan  Completed   Meningitis B Vaccine  Aged Out       09/24/2024    1:59 PM  Advanced Directives  Does Patient Have a Medical Advance Directive? No  Would patient like information on creating a medical advance directive? No - Patient declined    Vision: Annual vision screenings are recommended for early detection of glaucoma, cataracts, and diabetic retinopathy. These exams can also reveal signs of chronic conditions such as diabetes and high blood pressure.  Dental: Annual dental screenings help detect early signs of oral cancer, gum disease, and other conditions linked to  overall health, including heart disease and diabetes.  Please see the attached documents for additional preventive care recommendations.

## 2024-09-24 NOTE — Progress Notes (Signed)
 "  Chief Complaint  Patient presents with   Annual Exam   Medicare Wellness     Subjective:   [name] is a 83 y.o. female who presents for a Medicare Annual Wellness Visit.  Visit info / Clinical Intake: Medicare Wellness Visit Type:: Subsequent Annual Wellness Visit Persons participating in visit and providing information:: patient Medicare Wellness Visit Mode:: In-person (required for WTM) Interpreter Needed?: No Pre-visit prep was completed: yes AWV questionnaire completed by patient prior to visit?: no Living arrangements:: lives with spouse/significant other Patient's Overall Health Status Rating: very good Typical amount of pain: none Does pain affect daily life?: no Are you currently prescribed opioids?: no  Dietary Habits and Nutritional Risks How many meals a day?: 3 Eats fruit and vegetables daily?: yes Most meals are obtained by: preparing own meals In the last 2 weeks, have you had any of the following?: none Diabetic:: no  Functional Status Activities of Daily Living (to include ambulation/medication): Independent Ambulation: Independent Medication Administration: Independent Home Management (perform basic housework or laundry): Independent Manage your own finances?: yes Primary transportation is: driving Concerns about vision?: no *vision screening is required for WTM* Concerns about hearing?: no  Fall Screening Falls in the past year?: 0 Number of falls in past year: 0 Was there an injury with Fall?: 0 Fall Risk Category Calculator: 0 Patient Fall Risk Level: Low Fall Risk  Fall Risk Patient at Risk for Falls Due to: No Fall Risks Fall risk Follow up: Falls prevention discussed; Education provided; Falls evaluation completed  Home and Transportation Safety: All rugs have non-skid backing?: N/A, no rugs All stairs or steps have railings?: (!) no Grab bars in the bathtub or shower?: yes Have non-skid surface in bathtub or shower?: yes Good  home lighting?: yes Regular seat belt use?: yes Hospital stays in the last year:: no  Cognitive Assessment Difficulty concentrating, remembering, or making decisions? : no Will 6CIT or Mini Cog be Completed: yes What year is it?: 0 points What month is it?: 0 points About what time is it?: 0 points Count backwards from 20 to 1: 0 points Say the months of the year in reverse: 0 points Repeat the address phrase from earlier: 0 points 6 CIT Score: 0 points  Advance Directives (For Healthcare) Does Patient Have a Medical Advance Directive?: No Would patient like information on creating a medical advance directive?: No - Patient declined  Reviewed/Updated  Reviewed/Updated: Reviewed All (Medical, Surgical, Family, Medications, Allergies, Care Teams, Patient Goals)    Allergies (verified) Patient has no known allergies.   Current Medications (verified) Outpatient Encounter Medications as of 09/24/2024  Medication Sig   ALPRAZolam  (XANAX ) 0.5 MG tablet TAKE 1 TABLET BY MOUTH EVERY MORNING AND 2 AT BEDTIME   cholecalciferol (VITAMIN D ) 1000 UNITS tablet Take 2,000 Units by mouth daily.   fluticasone (FLONASE) 50 MCG/ACT nasal spray Place into both nostrils daily.   hydrochlorothiazide  (HYDRODIURIL ) 25 MG tablet Take 1 tablet (25 mg total) by mouth daily.   levothyroxine  (SYNTHROID ) 50 MCG tablet Take 1 tablet (50 mcg total) by mouth daily.   metoprolol  tartrate (LOPRESSOR ) 50 MG tablet Take 1 tablet (50 mg total) by mouth daily.   pantoprazole  (PROTONIX ) 40 MG tablet Take 1 tablet (40 mg total) by mouth daily.   simvastatin  (ZOCOR ) 20 MG tablet Take 1 tablet (20 mg total) by mouth daily.   valsartan  (DIOVAN ) 80 MG tablet Take 1 tablet (80 mg total) by mouth daily.   [DISCONTINUED] levothyroxine  (SYNTHROID )  75 MCG tablet Take 1 tablet (75 mcg total) by mouth daily.   [DISCONTINUED] cyclobenzaprine  (FLEXERIL ) 10 MG tablet Take 1 tablet (10 mg total) by mouth 3 (three) times daily as  needed for muscle spasms. (Patient not taking: Reported on 08/27/2024)   [DISCONTINUED] meloxicam  (MOBIC ) 15 MG tablet Take 1 tablet (15 mg total) by mouth daily. (Patient not taking: Reported on 08/27/2024)   Facility-Administered Encounter Medications as of 09/24/2024  Medication   ipratropium (ATROVENT ) 0.06 % nasal spray 2 spray    History: Past Medical History:  Diagnosis Date   Anxiety    Hearing loss of both ears    History of migraines    Hyperlipidemia    Hypertension    Past Surgical History:  Procedure Laterality Date   ABDOMINAL HYSTERECTOMY     carotid bruit     left   hearing loss     bilateral   migraine      Family History  Problem Relation Age of Onset   Cancer Father    Breast cancer Neg Hx    Social History   Occupational History   Not on file  Tobacco Use   Smoking status: Never   Smokeless tobacco: Never  Substance and Sexual Activity   Alcohol use: Never   Drug use: No   Sexual activity: Not on file   Tobacco Counseling Counseling given: No  SDOH Screenings   Food Insecurity: No Food Insecurity (09/24/2024)  Housing: Low Risk (09/24/2024)  Transportation Needs: No Transportation Needs (09/24/2024)  Utilities: Not At Risk (09/24/2024)  Alcohol Screen: Low Risk (09/24/2024)  Depression (PHQ2-9): Low Risk (09/24/2024)  Financial Resource Strain: Low Risk (09/24/2024)  Physical Activity: Inactive (09/24/2024)  Social Connections: Socially Integrated (09/24/2024)  Stress: No Stress Concern Present (09/24/2024)  Tobacco Use: Low Risk (09/24/2024)  Health Literacy: Adequate Health Literacy (09/24/2024)   See flowsheets for full screening details  Depression Screen PHQ 2 & 9 Depression Scale- Over the past 2 weeks, how often have you been bothered by any of the following problems? Little interest or pleasure in doing things: 0 Feeling down, depressed, or hopeless (PHQ Adolescent also includes...irritable): 0 PHQ-2 Total Score:  0 Trouble falling or staying asleep, or sleeping too much: 1 Feeling tired or having little energy: 1 Poor appetite or overeating (PHQ Adolescent also includes...weight loss): 0 Feeling bad about yourself - or that you are a failure or have let yourself or your family down: 1 Trouble concentrating on things, such as reading the newspaper or watching television (PHQ Adolescent also includes...like school work): 0 Moving or speaking so slowly that other people could have noticed. Or the opposite - being so fidgety or restless that you have been moving around a lot more than usual: 0 Thoughts that you would be better off dead, or of hurting yourself in some way: 0 If you checked off any problems, how difficult have these problems made it for you to do your work, take care of things at home, or get along with other people?: Not difficult at all     Goals Addressed   None          Objective:    Today's Vitals   09/24/24 1355  BP: 128/70  Pulse: 63  SpO2: 97%  Weight: 155 lb (70.3 kg)  Height: 5' 2 (1.575 m)  PainSc: 0-No pain   Body mass index is 28.35 kg/m.  Hearing/Vision screen No results found. Immunizations and Health Maintenance Health Maintenance  Topic Date  Due   Zoster Vaccines- Shingrix (1 of 2) 01/22/1958   DTaP/Tdap/Td (2 - Td or Tdap) 12/30/2022   COVID-19 Vaccine (7 - Pfizer risk 2025-26 season) 01/18/2025   Medicare Annual Wellness (AWV)  09/24/2025   Pneumococcal Vaccine: 50+ Years  Completed   Influenza Vaccine  Completed   Bone Density Scan  Completed   Meningococcal B Vaccine  Aged Out        Assessment/Plan:  This is a routine wellness examination for Sheri.  Patient Care Team: [name] PARAS, MD as PCP - General (Internal Medicine) [name] Maude BROCKS, MD as PCP - Cardiology (Cardiology)  I have personally reviewed and noted the following in the patients chart:   Medical and social history Use of alcohol, tobacco or illicit drugs  Current  medications and supplements including opioid prescriptions. Functional ability and status Nutritional status Physical activity Advanced directives List of other physicians Hospitalizations, surgeries, and ER visits in previous 12 months Vitals Screenings to include cognitive, depression, and falls Referrals and appointments  Orders Placed This Encounter  Procedures   MM 3D DIAGNOSTIC MAMMOGRAM BILATERAL BREAST    Reason for Exam (SYMPTOM  OR DIAGNOSIS REQUIRED):   health maintenance    Preferred imaging location?:   [facility]   Microalbumin / creatinine urine ratio   POCT URINALYSIS DIP (CLINITEK)   In addition, I have reviewed and discussed with patient certain preventive protocols, quality metrics, and best practice recommendations. A written personalized care plan for preventive services as well as general preventive health recommendations were provided to patient.   Carsynn Bethune, CMA   09/24/2024   Return in about 6 weeks (around 11/05/2024).  After Visit Summary: (In Person-Printed) AVS printed and given to the patient  Nurse Notes: none   I, Ronal PARAS Perri, MD, have reviewed all documentation for this visit. The documentation on 09/24/2024 for the exam, diagnosis, procedures, and orders are all accurate and complete.  "

## 2024-09-25 LAB — MICROALBUMIN / CREATININE URINE RATIO
Creatinine, Urine: 64 mg/dL (ref 20–275)
Microalb Creat Ratio: 5 mg/g{creat}
Microalb, Ur: 0.3 mg/dL

## 2024-10-02 ENCOUNTER — Encounter: Payer: Self-pay | Admitting: Internal Medicine

## 2024-11-04 ENCOUNTER — Telehealth: Admitting: Internal Medicine

## 2024-11-04 ENCOUNTER — Encounter: Payer: Self-pay | Admitting: Internal Medicine

## 2024-11-04 DIAGNOSIS — Z20828 Contact with and (suspected) exposure to other viral communicable diseases: Secondary | ICD-10-CM | POA: Diagnosis not present

## 2024-11-04 MED ORDER — OSELTAMIVIR PHOSPHATE 75 MG PO CAPS: 75.0000 mg | ORAL_CAPSULE | Freq: Two times a day (BID) | ORAL | 0 refills | Status: AC

## 2024-11-04 NOTE — Patient Instructions (Signed)
 Tamiflu  75 mg twice daily for 5 days prescribed as prophylaxis as husband who was vaccinated  for Influenza tested positive for Influenza today. Patient is elderly and at high risk for flu complications.

## 2024-11-04 NOTE — Progress Notes (Signed)
 "   Patient Care Team: Baxley, Ronal PARAS, MD as PCP - General (Internal Medicine) [name] Maude BROCKS, MD as PCP - Cardiology (Cardiology)  I connected with [name] on 11/04/24 at 2:19 PM by video enabled telemedicine visit and verified that I am speaking with the correct person using two identifiers.   I discussed the limitations, risks, security and privacy concerns of performing an evaluation and management service by telemedicine and the availability of in-person appointments. I also discussed with the patient that there may be a patient responsible charge related to this service. The patient expressed understanding and agreed to proceed.   Other persons participating in the visit and their role in the encounter: Medical scribe, Nestora BROCKS Bathe  Patients location: Home  Providers location: Clinic   I provided 10 minutes of   time during this encounter, and > 50% was spent counseling as documented under my assessment & plan. Video connection failed despite significant effort and time with patient and her husband so we had to proceed with audio only.  Patient and husband were unable to connect video so we have to proceed with audio alone.  Chief Complaint: Influenza exposure    Subjective:    Patient ID: [name] , Female    DOB: [birth year], 84 y.o.    MRN: [id number]   84 y.o. Female presents today for Influenza exposure. Patient has a past medical history of Hypothyroidism; Hyperlipidemia; Hypertension; Anxiety.  Her husband came into the office today and tested positive for Influenza A. Both patient and her husband received Influenza vaccine this season at [employer]. She does not have any symptoms currently. She however is 84 years old with anxiety, GERD, hyperlipidemia, HTN and hypothyroidism. Would be at high risk for secondary bacterial infection such as pneumonia if she contracted Influenza.    Past Medical History:  Diagnosis Date   Anxiety    Hearing loss of both  ears    History of migraines    Hyperlipidemia    Hypertension      Family History  Problem Relation Age of Onset   Cancer Father    Breast cancer Neg Hx     Family History Narrative: Father died of cancer at age 70. He had history of MI. Mother with history of hypertension. Sister with history of hypertension.    Social history: She is married. Completed high school. She is retired. Does not smoke or consume alcohol.      ROS currently no symptoms of influenza but husband seen today and is symptomatic. Both had influenza vaccines this season      Objective:   Vitals: There were no vitals taken for this visit. Patient does not think she has a fever.   Physical Exam Currently without flu symptoms. She is at high for complications of Influenza and I feel prophylaxis with Tamiflu  is very reasonable.   Results:   Studies obtained and personally reviewed by me:   Labs:       Component Value Date/Time   NA 143 09/07/2024 1005   NA 136 06/11/2024 1404   K 4.7 09/07/2024 1005   CL 104 09/07/2024 1005   CO2 28 09/07/2024 1005   GLUCOSE 99 09/07/2024 1005   BUN 19 09/07/2024 1005   BUN 20 06/11/2024 1404   CREATININE 1.04 (H) 09/07/2024 1005   CALCIUM 10.2 09/07/2024 1005   PROT 6.5 09/07/2024 1005   ALBUMIN 4.3 09/06/2021 1306   AST 16 09/07/2024 1005  ALT 11 09/07/2024 1005   ALKPHOS 57 09/06/2021 1306   BILITOT 0.6 09/07/2024 1005   GFRNONAA >60 09/06/2021 1306   GFRNONAA 52 (L) 07/13/2020 0959   GFRAA 60 07/13/2020 0959     Lab Results  Component Value Date   WBC 4.8 09/07/2024   HGB 14.8 09/07/2024   HCT 44.8 09/07/2024   MCV 86.7 09/07/2024   PLT 283 09/07/2024    Lab Results  Component Value Date   CHOL 157 09/07/2024   HDL 65 09/07/2024   LDLCALC 75 09/07/2024   TRIG 87 09/07/2024   CHOLHDL 2.4 09/07/2024    Lab Results  Component Value Date   HGBA1C 5.5 09/07/2024     Lab Results  Component Value Date   TSH 0.31 (L) 09/07/2024         Assessment & Plan:   Meds ordered this encounter  Medications   oseltamivir  (TAMIFLU ) 75 MG capsule    Sig: Take 1 capsule (75 mg total) by mouth 2 (two) times daily.    Dispense:  10 capsule    Refill:  0     Exposure to influenza: Her husband came into the office today and tested positive for Influenza. Both her and her husband are up to date on their influenza vaccines. She does not have any symptoms.   Tamiflu  75 mg twice daily x 5 days prescribed.   Needs 6 month follow up in June  I,Makayla C Reid,acting as a scribe for Ronal JINNY Hailstone, MD.,have documented all relevant documentation on the behalf of Ronal JINNY Hailstone, MD,as directed by  Ronal JINNY Hailstone, MD while in the presence of Ronal JINNY Hailstone, MD. I, Ronal JINNY Hailstone, MD, have reviewed all documentation for this visit. The documentation on 11/04/2024 for the exam, diagnosis, procedures, and orders are all accurate and complete.   "

## 2024-11-12 ENCOUNTER — Other Ambulatory Visit

## 2024-11-12 DIAGNOSIS — E063 Autoimmune thyroiditis: Secondary | ICD-10-CM

## 2025-03-29 ENCOUNTER — Other Ambulatory Visit

## 2025-03-31 ENCOUNTER — Ambulatory Visit: Admitting: Internal Medicine
